# Patient Record
Sex: Female | Born: 1975 | Race: White | Hispanic: No | Smoking: Never smoker
Health system: Southern US, Community
[De-identification: ages and names within clinical notes are randomized; demographics above are authoritative.]

## PROBLEM LIST (undated history)

## (undated) DIAGNOSIS — M1990 Unspecified osteoarthritis, unspecified site: Secondary | ICD-10-CM

## (undated) DIAGNOSIS — B999 Unspecified infectious disease: Secondary | ICD-10-CM

## (undated) DIAGNOSIS — E119 Type 2 diabetes mellitus without complications: Secondary | ICD-10-CM

## (undated) DIAGNOSIS — A6000 Herpesviral infection of urogenital system, unspecified: Secondary | ICD-10-CM

## (undated) DIAGNOSIS — E079 Disorder of thyroid, unspecified: Secondary | ICD-10-CM

## (undated) DIAGNOSIS — Q6589 Other specified congenital deformities of hip: Secondary | ICD-10-CM

## (undated) DIAGNOSIS — N809 Endometriosis, unspecified: Secondary | ICD-10-CM

## (undated) HISTORY — PX: JOINT REPLACEMENT: SHX530

## (undated) HISTORY — PX: APPENDECTOMY: SHX54

## (undated) HISTORY — DX: Herpesviral infection of urogenital system, unspecified: A60.00

## (undated) HISTORY — DX: Unspecified osteoarthritis, unspecified site: M19.90

---

## 1999-02-28 ENCOUNTER — Other Ambulatory Visit: Admission: RE | Admit: 1999-02-28 | Discharge: 1999-02-28 | Payer: Self-pay | Admitting: Obstetrics and Gynecology

## 2000-04-22 ENCOUNTER — Other Ambulatory Visit: Admission: RE | Admit: 2000-04-22 | Discharge: 2000-04-22 | Payer: Self-pay | Admitting: Obstetrics and Gynecology

## 2000-10-14 ENCOUNTER — Other Ambulatory Visit: Admission: RE | Admit: 2000-10-14 | Discharge: 2000-10-14 | Payer: Self-pay | Admitting: Obstetrics and Gynecology

## 2001-01-19 ENCOUNTER — Encounter: Admission: RE | Admit: 2001-01-19 | Discharge: 2001-04-19 | Payer: Self-pay | Admitting: Obstetrics and Gynecology

## 2001-02-03 ENCOUNTER — Inpatient Hospital Stay (HOSPITAL_COMMUNITY): Admission: AD | Admit: 2001-02-03 | Discharge: 2001-02-03 | Payer: Self-pay | Admitting: Obstetrics and Gynecology

## 2001-02-14 ENCOUNTER — Encounter (HOSPITAL_COMMUNITY): Admission: RE | Admit: 2001-02-14 | Discharge: 2001-02-21 | Payer: Self-pay | Admitting: Obstetrics and Gynecology

## 2001-02-18 ENCOUNTER — Encounter (INDEPENDENT_AMBULATORY_CARE_PROVIDER_SITE_OTHER): Payer: Self-pay

## 2001-02-18 ENCOUNTER — Inpatient Hospital Stay (HOSPITAL_COMMUNITY): Admission: AD | Admit: 2001-02-18 | Discharge: 2001-02-23 | Payer: Self-pay | Admitting: Obstetrics and Gynecology

## 2001-02-18 ENCOUNTER — Encounter: Payer: Self-pay | Admitting: Obstetrics and Gynecology

## 2001-03-22 ENCOUNTER — Other Ambulatory Visit: Admission: RE | Admit: 2001-03-22 | Discharge: 2001-03-22 | Payer: Self-pay | Admitting: Obstetrics and Gynecology

## 2003-02-07 ENCOUNTER — Other Ambulatory Visit: Admission: RE | Admit: 2003-02-07 | Discharge: 2003-02-07 | Payer: Self-pay | Admitting: Obstetrics and Gynecology

## 2003-07-18 ENCOUNTER — Encounter: Admission: RE | Admit: 2003-07-18 | Discharge: 2003-08-22 | Payer: Self-pay | Admitting: Family Medicine

## 2004-05-15 ENCOUNTER — Other Ambulatory Visit: Admission: RE | Admit: 2004-05-15 | Discharge: 2004-05-15 | Payer: Self-pay | Admitting: Obstetrics and Gynecology

## 2005-09-07 ENCOUNTER — Other Ambulatory Visit: Admission: RE | Admit: 2005-09-07 | Discharge: 2005-09-07 | Payer: Self-pay | Admitting: Obstetrics and Gynecology

## 2006-02-02 ENCOUNTER — Inpatient Hospital Stay (HOSPITAL_COMMUNITY): Admission: RE | Admit: 2006-02-02 | Discharge: 2006-02-05 | Payer: Self-pay | Admitting: Orthopedic Surgery

## 2007-08-23 ENCOUNTER — Ambulatory Visit (HOSPITAL_COMMUNITY): Admission: RE | Admit: 2007-08-23 | Discharge: 2007-08-23 | Payer: Self-pay | Admitting: Obstetrics and Gynecology

## 2007-09-19 ENCOUNTER — Ambulatory Visit (HOSPITAL_COMMUNITY): Admission: RE | Admit: 2007-09-19 | Discharge: 2007-09-19 | Payer: Self-pay | Admitting: Internal Medicine

## 2007-10-17 ENCOUNTER — Ambulatory Visit (HOSPITAL_COMMUNITY): Admission: RE | Admit: 2007-10-17 | Discharge: 2007-10-17 | Payer: Self-pay | Admitting: Obstetrics and Gynecology

## 2007-11-21 ENCOUNTER — Ambulatory Visit (HOSPITAL_COMMUNITY): Admission: RE | Admit: 2007-11-21 | Discharge: 2007-11-21 | Payer: Self-pay | Admitting: Obstetrics and Gynecology

## 2007-12-21 ENCOUNTER — Ambulatory Visit (HOSPITAL_COMMUNITY): Admission: RE | Admit: 2007-12-21 | Discharge: 2007-12-21 | Payer: Self-pay | Admitting: Obstetrics and Gynecology

## 2010-03-04 ENCOUNTER — Inpatient Hospital Stay (HOSPITAL_COMMUNITY): Admission: RE | Admit: 2010-03-04 | Discharge: 2010-03-06 | Payer: Self-pay | Admitting: Orthopedic Surgery

## 2010-09-14 ENCOUNTER — Encounter: Payer: Self-pay | Admitting: Obstetrics and Gynecology

## 2010-11-09 LAB — BASIC METABOLIC PANEL
BUN: 3 mg/dL — ABNORMAL LOW (ref 6–23)
Calcium: 8 mg/dL — ABNORMAL LOW (ref 8.4–10.5)
Calcium: 9 mg/dL (ref 8.4–10.5)
Chloride: 103 mEq/L (ref 96–112)
Creatinine, Ser: 0.82 mg/dL (ref 0.4–1.2)
GFR calc Af Amer: >60 mL/min (ref >60)
GFR calc Af Amer: >60 mL/min (ref >60)
GFR calc non Af Amer: >60 mL/min (ref >60)
GFR calc non Af Amer: >60 mL/min (ref >60)
Glucose, Bld: 199 mg/dL — ABNORMAL HIGH (ref 70–99)
Glucose, Bld: 252 mg/dL — ABNORMAL HIGH (ref 70–99)
Potassium: 3.4 mEq/L — ABNORMAL LOW (ref 3.5–5.1)
Potassium: 4.2 mEq/L (ref 3.5–5.1)
Potassium: 4.3 mEq/L (ref 3.5–5.1)
Sodium: 136 mEq/L (ref 135–145)
Sodium: 138 mEq/L (ref 135–145)

## 2010-11-09 LAB — CBC
HCT: 31.2 % — ABNORMAL LOW (ref 36.0–46.0)
HCT: 33.6 % — ABNORMAL LOW (ref 36.0–46.0)
HCT: 40 % (ref 36.0–46.0)
Hemoglobin: 10.8 g/dL — ABNORMAL LOW (ref 12.0–15.0)
Hemoglobin: 13.8 g/dL (ref 12.0–15.0)
MCHC: 34.2 g/dL (ref 30.0–36.0)
MCHC: 34.5 g/dL (ref 30.0–36.0)
MCV: 86.8 fL (ref 78.0–100.0)
Platelets: 233 10*3/uL (ref 150–400)
Platelets: 270 10*3/uL (ref 150–400)
RBC: 3.59 MIL/uL — ABNORMAL LOW (ref 3.87–5.11)
RDW: 14.2 % (ref 11.5–15.5)
WBC: 5 10*3/uL (ref 4.0–10.5)
WBC: 8.2 10*3/uL (ref 4.0–10.5)
WBC: 8.7 10*3/uL (ref 4.0–10.5)

## 2010-11-09 LAB — DIFFERENTIAL
Basophils Absolute: 0 10*3/uL (ref 0.0–0.1)
Basophils Relative: 1 % (ref 0–1)
Lymphocytes Relative: 36 % (ref 12–46)
Monocytes Relative: 5 % (ref 3–12)
Neutro Abs: 2.8 10*3/uL (ref 1.7–7.7)
Neutrophils Relative %: 56 % (ref 43–77)

## 2010-11-09 LAB — URINALYSIS, ROUTINE W REFLEX MICROSCOPIC
Bilirubin Urine: NEGATIVE
Hgb urine dipstick: NEGATIVE
Nitrite: NEGATIVE
Protein, ur: NEGATIVE mg/dL
Urobilinogen, UA: 0.2 mg/dL (ref 0.0–1.0)

## 2010-11-09 LAB — PROTIME-INR: INR: 0.99 (ref 0.00–1.49)

## 2010-11-09 LAB — GLUCOSE, CAPILLARY
Glucose-Capillary: 179 mg/dL — ABNORMAL HIGH (ref 70–99)
Glucose-Capillary: 185 mg/dL — ABNORMAL HIGH (ref 70–99)
Glucose-Capillary: 229 mg/dL — ABNORMAL HIGH (ref 70–99)
Glucose-Capillary: 240 mg/dL — ABNORMAL HIGH (ref 70–99)
Glucose-Capillary: 247 mg/dL — ABNORMAL HIGH (ref 70–99)

## 2010-11-09 LAB — TYPE AND SCREEN: ABO/RH(D): B POS

## 2010-11-09 LAB — SURGICAL PCR SCREEN: MRSA, PCR: NEGATIVE

## 2010-11-09 LAB — PREGNANCY, URINE: Preg Test, Ur: NEGATIVE

## 2010-11-09 LAB — APTT: aPTT: 29 seconds (ref 24–37)

## 2011-01-09 NOTE — Op Note (Signed)
[facility] of [facility]  Patient:    [name], [name]                        MRN: [id number] Proc. Date: 02/20/01 Adm. Date:  [id number] Attending:  Wandalee Ferdinand                           Operative Report  PREOPERATIVE DIAGNOSES:       1. Intrauterine pregnancy at 30 weeks                                  and five days gestation.                               2. Preeclampsia - severe.                               3. Intrauterine growth restriction.  POSTOPERATIVE DIAGNOSES:      1. Intrauterine pregnancy at 30 weeks                                  and five days gestation.                               2. Preeclampsia - severe.                               3. Intrauterine growth restriction.  OPERATION:                    Primary low transverse cesarean section.  SURGEON:                      Rudy Jew. Ashley Royalty, M.D.  ANESTHESIA:                   Epidural.  FINDINGS:                     A 2 pound 2 ounce female, Apgars 7 at one minute and 8 at five minutes and sent to the neonatal intensive care unit.  Arterial cord pH 7.29.  ESTIMATED BLOOD LOSS:         600 cc.  COMPLICATIONS:                None.  PACKS AND DRAINS:             Foley catheter.  COUNTS:                       Sponge, needle and instrument counts were correct reported as correct x 2.  DESCRIPTION OF PROCEDURE:     The patient was taken to the operating room and placed in the sitting position where after adequate epidural anesthesia was administered, she was placed in the supine position and prepped and draped in the usual manner for abdominal surgery.  Foley catheter was placed.  The Pfannenstiel incision was made down to the level of the fascia which was nicked with a knife, incised transversely with  Mayo scissors.  The underlying rectus muscles were separated from the fascia using sharp and blunt dissection.  Rectus muscles were separated in the midline exposing  the peritoneum which was elevated with hemostats and entered atraumatically with Metzenbaum scissors.  The incision was extended longitudinally.  Uterus was identified and a bladder flap was freed by incising anterior uterine serosa and sharply and bluntly dissecting the bladder inferiorly.  It was held in place with a bladder blade.  Uterus was then entered through a low transferred incision using sharp and blunt dissection.  Membranes were ruptured. The fluid was noted to be clear.  The infant was delivered from the vertex presentation in an atraumatic manner.  The infant was suctioned.  The cord was triply clamped, cut and the infant given immediately to the waiting pediatrics team, Dr. Alison Murray, in attendance.  Arterial cord pH was obtained from an isolated segment.  Next, regular cord blood was obtained.  The placenta and membranes were removed in their entirety and submitted to pathology for histologic studies.  The uterus was exteriorized.  The uterus was then closed with running layers of #1 Vicryl, the first in a running locking layer, the second a was a running, intermittently locking and imbricating layer.  Two additional figure-of-eight sutures were required to obtain hemostasis.  Hemostasis was noted.  Uterus, tubes and ovaries were noted to be otherwise unremarkable. They were returned to the abdominal cavity.  Copious irrigation was accomplished.  Hemostasis was once again noted.  The fascia was then closed with 0 Vicryl in a running fashion.  Copious irrigation was accomplished. Hemostasis was noted.  The skin was closed with staples.  At the conclusion of the procedure, the urine was clear and copious.  The patient was then returned to the recovery room in excellent condition. DD:  02/20/01 TD:  02/20/01 Job: [id number]

## 2011-01-09 NOTE — Op Note (Signed)
[name], [name]              ACCOUNT NO.:  [id number]   MEDICAL RECORD NO.:  [id number]          PATIENT TYPE:  INP   LOCATION:  0002                         FACILITY:  [facility]   PHYSICIAN:  Madlyn Frankel. Charlann Boxer, M.D.  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   POSTOPERATIVE DIAGNOSIS:  Right hip osteoarthritis secondary to  developmental dysplasia of the hip.   PROCEDURE:  Right total hip replacement.   COMPONENTS USED:  1.  48 mm Duraloc option cup, single cancellous bone screw.  2.  A Summit Duofix HA coated high offset size 2 stem and a 28 +1.5 ball.   SURGEON:  Madlyn Frankel. Charlann Boxer, M.D.   ASSISTANT:  Cherly Beach, M.D.   ANESTHESIA:  General.   BLOOD LOSS:  13 mL.   DRAINS:  None.   COMPLICATIONS:  None.   INDICATIONS FOR PROCEDURE:  [name] is a 35 year old female who presented in  consultation for right hip pain.  Radiographically she had evidence of  dysplasia which was a mild degree but had advanced degenerative changes of  the hip.  She had failed conservative measures and based on her quality of  life at the time was very unhappy with the way things were going.  Given her  age and potential childbearing age, we had significant discussion of hip  replacement procedures including bearing surfaces.  We discussed the  potential need for revision surgery at some point in her lifetime, talked  about bearing surface, metal, iron, placenta and concerns were raised.  The  plan was designed for her to have a total hip replacement with ceramic on  ceramic bearing surface. Risks and dislocation, infection, and other  complications from the hip were all discussed and consent obtained.   PROCEDURE IN DETAIL:  The patient was brought to operative theater.  Once  adequate anesthesia and  preoperative antibiotics were administered the  patient was positioned in the left lateral decubitus position right side up.  The right lower extremity prepped and draped in sterile fashion following  pre scrub. Lateral incision was made for posterior approach to the hip.  Iliotibial band and gluteal fascia was incised in line with the incision.  Short external rotators taken down separate from the posterior capsule which  was saved for protection against the sciatic nerve.  In order to expose the  posterior aspect of the acetabulum during the case, I was unable to get this  to be reapproximated due to some debridement associated with labrectomy.  Nonetheless, tissue was preserved for protection against the sciatic nerve.   The hip was dislocated.  The neck osteotomy was initially made for the S-Rom  femoral component.   The plan was to put S-Rom in based on her hip dysplasia however, as will be  noted when I reamed I was unable to get to a size 52 cup and for that reason  I could not S-Rom which  is limited to 32 mm ceramic head.  Following the  neck osteotomy, attention was directed to acetabulum.   Acetabular exposure was obtained including the labrectomy.  Once acetabulum  was fully exposed I began reaming with a 43 reamer down to the medial wall  to get adequate coverage.  I then sequentially reamed up but had to stop  with a 47 reamer based on the size of the patient's pelvis.  The decision  was made to switch over to Summit which was outside the room and brought in  sterilely.   The final 48 mm Duraloc option cup was impacted with 35-40 degrees of  abduction and forward flexed 20 degrees anatomically.  A trial liner was  placed prior to permission any screws for a trial reduction.  At this point  attention was directed to the femur. Femoral exposure was obtained.  Based  on the change S-Rom to Summit the tip of the trochanter was identified. The  canal was entered and reaming  began axially with straight reamers.  I was  able to ream down to the size 3 with the axis straight reamers from the  axilla reamers.  For this reason I began broaching with a 1.  I did pass the  1 broach with the tip of this head marked to the tip of the trochanter in  effort to get an idea and once this was down I used the calcar planer in  order to mill down the proximal femur followed by the use of an oscillating  saw to remove further bone.  This allowed my femoral head to be at the tip  of the trochanter.   Following this, I broached with a size 2 broach and had an excellent fit. I  did not feel I was able to get back up to a size 3 because there was already  exposed medial calcar bone.  The trial reduction was carried out with  excellent stability with a 28 1.5 mm ball.  Soft tissues appeared to be  appropriately tensioned.   At this point trial components were removed.   The single cancellous bone screw was placed into the ileum with excellent  purchase.  The final ceramic 48/50 x 28 mm ceramic liner was impacted  without complication.  The rim of the cup was entirely visualized through  this whole time.   Following this, attention was directed to the femur where the final size 2  high offset femoral stem was impacted to the level where the broach was.  Based on this I did repeat a trial with 1.5 and felt the hip was very stable  with 1-2 mm of shuck and the leg lengths appeared to be appropriate.  She  was a little short to begin with.   The final 28 1.5 ceramic ball was then impacted onto a clean and dry  trunnion without complication and the hip reduced.  Throughout the case the  hip was irrigated again at this point based on her soft tissues and a  capsulectomy performed.  No capsular repair was carried out no  reapproximating of the short external rotators carried out.   At this point #1 Ethibond was utilized to reapproximate the iliotibial band, a running #1 Vicryl  was used on the gluteal fascia.  The remaining wound was  closed in layers with a 4-0 Monocryl on the skin.  Hip was cleaned, dried,  dressed sterilely with Steri-Strips, dressing, sponges, and tape.  The  patient  was transferred to recovery in stable condition.     Madlyn Frankel Charlann Boxer, M.D.  Electronically Signed    MDO/MEDQ  D:  02/02/2006  T:  02/02/2006  Job:  [id number]

## 2011-01-09 NOTE — H&P (Signed)
NAMEMarland Kitchen  [name], [name]              ACCOUNT NO.:  [id number]   MEDICAL RECORD NO.:  [id number]          PATIENT TYPE:  INP   LOCATION:  NA                           FACILITY:  [facility]   PHYSICIAN:  Lynn Frankel. Charlann [place], M.D.  DATE OF BIRTH:  [birth year]   DATE OF ADMISSION:  02/02/2006  DATE OF DISCHARGE:                                HISTORY & PHYSICAL   CHIEF COMPLAINT:  Pain in my right hip.   PRESENT ILLNESS:  This 35 year old white female seen by [place] for continued  progressive problems concerning pain into her right hip.  She was referred  to [place] through the courtesy of Dr. Allie Bossier of [facility].  She has had problems with this hip for considerable period of time.  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  She has shown to have advanced  joint space collapse to the hip.  It was noted the patient had more medial  wear on the hip.  After much discussion including the risks, the benefits of  surgery we decided the patient would benefit from surgical intervention and  is being admitted for a total hip replacement arthroplasty utilizing the  ceramic on ceramic system with DePuy SROM.   PAST MEDICAL HISTORY:  This young lady has been in relatively good health  throughout her lifetime.  She is hypothyroid, takes Synthroid 0.125 mg a  day.  She has no medical allergies.  She has no family physician.  She also  has history of hypertension, but only during her pregnancy.  She had a  cesarean section in [place] of 2002 with no problems.   FAMILY HISTORY:  Positive for hypertension in her mother's side and lung  cancer in family.   SOCIAL HISTORY:  The patient is married.  She is a Neurosurgeon.  No intake of tobacco or alcohol products.  Has one child 70 years  old and caregiver after surgery will be her mother and father.   REVIEW OF SYSTEMS:  CNS:  No seizures, shoulder  paralysis, numbness, or  double vision.  RESPIRATORY:  No productive cough.  No hemoptysis.  No  shortness of breath.  CARDIOVASCULAR:  No chest pain.  No angina.  No  orthopnea.  GASTROINTESTINAL:  No nausea, vomiting, melena, or bloody  stools.  GENITOURINARY:  No discharge, dysuria, hematuria.  MUSCULOSKELETAL:  Primarily in present illness.   PHYSICAL EXAMINATION:  GENERAL:  Alert, cooperative, and friendly somewhat  slightly overweight 35 year old white female.  VITAL SIGNS:  Blood pressure 130/86, pulse 72, respirations 12.  HEENT:  Normocephalic.  PERRLA.  EOM intact.  Oropharynx is clear.  CHEST:  Clear to auscultation.  No rhonchi.  No rales.  HEART:  Regular rate and rhythm.  No murmurs are heard.  ABDOMEN:  Soft, nontender.  Liver, spleen not felt.  GENITALIA:  Not done.  Not pertinent to present illness.  RECTAL:  Not done.  Not pertinent to present illness.  PELVIC:  Not done.  Not  pertinent to present illness.  BREASTS:  Not done.  Not pertinent to present illness.  EXTREMITIES:  Right hip shows pain with internal, external rotation on the  anterior lateral aspect of the hip.  She has a slight external rotation  contracture.  The hip has a tendency to fall on external rotation.   ADMITTING DIAGNOSES:  Dysplastic right hip with osteoarthritis.   PLAN:  Patient will undergo right total hip replacement arthroplasty.  We  plan for a three-day stay and then home with home physical therapy and her  family's support.      [name].      [name], M.D.  Electronically Signed    DLU/MEDQ  D:  01/29/2006  T:  01/29/2006  Job:  [id number]   cc:   Vanita Panda. Magnus Ivan, M.D.  Fax: [phone]

## 2011-01-09 NOTE — Discharge Summary (Signed)
[name], HANBACK              ACCOUNT NO.:  [id number]   MEDICAL RECORD NO.:  [id number]          PATIENT TYPE:  INP   LOCATION:  1519                         FACILITY:  [facility]   PHYSICIAN:  Madlyn Frankel. Charlann Boxer, M.D.  DATE OF BIRTH:  August 30, 1975   DATE OF ADMISSION:  02/02/2006  DATE OF DISCHARGE:  02/05/2006                                 DISCHARGE SUMMARY   ADMISSION DIAGNOSIS:  Dysplastic right hip with osteoarthritis.   DISCHARGE DIAGNOSES:  1.  Dysplastic right hip with osteoarthritis.  2.  Mild postoperative anemia    Operation on February 02, 2006, the patient underwent right total hip  replacement arthroplasty, Dooley L. Cherlynn June., assisted.   BRIEF HISTORY:  This is a 35 year old lady seen for progressive problems  concerning her right hip.  X-rays have shown dysplasia of the hip.  Advanced  degenerative changes were noted in the face of dysplasia.  A she really is  quite unhappy with her hip, having  more and more difficulty getting about.  She has nearly constant pain into the hip.  After discussing the risks and  benefits of surgery, specifically the fact that she is in childbearing ages,  it was decided to go ahead with a ceramic on ceramic bearing surface.   COURSE IN THE HOSPITAL:  The patient tolerated the surgical procedure quite  well, remained cheerful throughout her hospitalization, was very pleased  with the reduction of pain that she had into the hip.  She worked diligently  with occupational therapy and physical therapy and the total hip protocol.  Home health was arranged through [place].  On the day of discharge the wound  was dry.  Neurovascular was intact to the operative extremity.  We did not  use Coumadin protocol with her as we expect her to increase her activities  rapidly.  Enteric-coated daily will  be used.   Laboratory values in the hospital hematologically showed preoperative CBC  completely within normal limits.  Her hemoglobin was 13.0  with hematocrit  38.3.  Final hemoglobin was 10.2, was hematocrit 29.8.  Blood chemistries  remained normal.  Urinalysis essentially negative for a urinary tract  infection.  No electrocardiogram and no chest x-ray seen on this chart.   CONDITION ON DISCHARGE:  Improved, stable.   PLAN:  The patient is discharged to her home, continue partial weightbearing  on the operative extremity.  She will take:   1.  Two Tylenol q.6h.  2.  OxyContin 5 mg p.r.n. breakthrough pain  3.  Robaxin 500 mg for muscle spasm.  4.  One enteric-coated aspirin a day.  5.  Colace daily as a stool softener.   Return to see Dr. Charlann Boxer about 2 weeks after date of surgery.  Call if any  problems.      Dooley L. Cherlynn June.      Madlyn Frankel Charlann Boxer, M.D.  Electronically Signed    DLU/MEDQ  D:  02/17/2006  T:  02/17/2006  Job:  [id number]   cc:   Vanita Panda. Magnus Ivan, M.D.  Fax: [phone]

## 2011-01-09 NOTE — H&P (Signed)
[facility] of [facility]  Patient:    [name], [name]                        MRN: [id number] Adm. Date:  02/18/01 Attending:  Fayrene Fearing A. Ashley Royalty, M.D.                         History and Physical  HISTORY:                      This is a 35 year old prima gravida, EDC April 14, 2001 approximately 30 weeks 3 days gestation.  Prenatal care has been complicated by hypothyroidism as well as tendencies toward high blood pressure in recent weeks.  In addition, an ultrasound February 10, 2001 revealed mean gestational age of [redacted] weeks 0 days which was estimated to be the 10th percentile for that gestational age.  Estimated fetal weight was 1134 g. Amniotic fluid index was 8.2.  Initial blood pressure in the office on or about October 14, 2000 revealed a value of 150/76.  Blood pressure April 19 was 100/70, May 2 110/80, May 29 136/86, June 13 144/100.  On June 13 I took the blood pressure myself in the right arm supine and it was 132/90.  At that time a CBC was ordered and the hematocrit was 37.8, platelet count 270,000, SGOT 23, and SGPT 16.  Albumin was diminished at 2.6.  Nonstress test was ordered and reactive.  The afore mentioned ultrasound was ordered on that day as well.  Patient has since been coming back for more frequent antenatal visits.  Blood pressure June 17 was 140/100.  Patient continued to deny any severe symptoms.  On June 20 a 24 hour urine was ordered and the total protein was noted to be 6.2 g/dl.  Creatinine clearance was 127 which was felt to be normal for pregnancy.  Patient was seen in the office on June 24 at which time blood pressure by me in the left side right arm in the lateral recumbent position was 148/98. Patient continued to deny any "severe" symptoms.  She was asked to return on February 16, 2001 for repeat OB visit.  However, unfortunately she was noncompliant with that request.  Thereafter patient returned today for antenatal visit.  Blood  pressure by me in the right arm sitting was 162/105. In the right arm on the left side it was 144/100.  She continues to deny any visual disturbances, epigastric pain, headaches.  She is admitted for surveillance regarding the diagnosis of preeclampsia.  Laboratory determinations and consideration for the need for delivery.  MEDICATIONS:                  1. Prenatal vitamins.                               2. Synthroid 0.1 mg q.d.  PAST MEDICAL HISTORY:         Negative.  PAST SURGICAL HISTORY:        Negative.  ALLERGIES:                    None.  FAMILY HISTORY:               Noncontributory.  SOCIAL HISTORY:               Patient denies use of alcohol or  tobacco.  REVIEW OF SYSTEMS:            Noncontributory.  PHYSICAL EXAMINATION  GENERAL:                      Well-developed, well-nourished pleasant white female in no acute distress.  VITAL SIGNS:                  Afebrile.  Vital signs stable and as above.  SKIN:                         Warm and dry without lesions.  LYMPH:                        There is no supraclavicular, cervical, or inguinal adenopathy.  HEENT:                        Normocephalic.  NECK:                         Supple without thyromegaly.  CHEST:                        Lungs are clear.  CARDIAC:                      Regular rate and rhythm without murmurs, gallops, rubs.  BREASTS:                      Deferred.  ABDOMEN:                      Gravid with a fundal height of approximately 30 cm.  Fetal heart tones are auscultated with the Doppler.  MUSCULOSKELETAL:              Full range of motion with 1+ dependent edema. DTRs are 1+ and equal bilaterally.  PELVIC:                       Deferred.  ACCESSORY CLINICAL FINDINGS:  Nonstress test was performed today at maternity admissions and the result was reactive.  Blood pressure at that encounter was 159/98.  Urine protein in the office at this time is 4+.  IMPRESSION:                    1. Intrauterine pregnancy at 30 weeks 3 days                                  gestation.                               2. Preeclampsia, rule out severe.                               3. Intrauterine growth restriction probably                                  secondary to #2.  PLAN:  Patient is to be hospitalized.  Laboratory determinations will be made as well as fetal surveillance, ultrasound, and consideration for delivery. DD:  02/18/01 TD:  02/18/01 Job: [id number]

## 2011-01-09 NOTE — Discharge Summary (Signed)
[facility] of [facility]  Patient:    [name], [name]                        MRN: [id number] Adm. Date:  [id number] Disc. Date: [id number] Attending:  Wandalee Ferdinand                           Discharge Summary  DISCHARGE DIAGNOSES:          1. Intrauterine pregnancy at 30 weeks 3 days                                  gestation.                               2. Pregnancy induced hypertension-fear.                               3. Intrauterine growth restriction.                               4. Preterm birth living child.  OPERATIONS AND SPECIAL PROCEDURES:           Primary low transverse cesarean section.  CONSULTATIONS:                None.  DISCHARGE MEDICATIONS:        1. Aldomet 500 mg p.o. t.i.d.                               2. Tylox.  HISTORY AND PHYSICAL:         This is a 35 year old primigravida, [facility] April 26, 2001, approximately 30 weeks 3 days gestation. Prenatal care was complicated by hypothyroidism as well as tendencies toward high blood pressure in recent weeks. In addition, an ultrasound on February 10, 2001 revealed mean gestational age of [redacted] weeks 0 days which was estimated to be approximately the 10th percentile for that gestational age. The patient was brought into the hospital for evaluation of hypertension and lab work in order to determine the appropriateness of further expected management versus delivery. For the remainder of the past medical history and history and physical, please see chart.  HOSPITAL COURSE:              The patient was admitted to [facility]. Admission laboratory studies were drawn. The lab work looked reassuring. However, the patient was noted to have on ultrasound a 28 week 4 day composite gestational age with an estimated fetal weight of 1150 g, which was felt to be less than the 10th percentile. The amniotic fluid index was only 2.1. Decision was made to move toward delivery. The  patient was given two doses of betamethasone at 24-hour intervals. She subsequently was taken to the operating room on February 20, 2001. At that time, she underwent primary low transverse cesarean section. The procedure yielded a 2-pound 2-ounce female, Apgars 7 at one minute, 8 at five minutes, sent to the neonatal intensive care unit. Arterial cord pH was 7.29. The procedure was uncomplicated. The patients postoperative course was quite benign. She continued to have modest residual hypertension for which  she was started on Aldomet 500 mg p.o. t.i.d. The preeclampsia resolved nicely and magnesium sulfate was ultimately discontinued. The patient was discharged home on February 23, 2001 afebrile and in satisfactory condition.  DISPOSITION:                  The patient is to return to [facility] in several days for repeat blood pressure assessment. In addition, she is to return in four to six weeks for standard postoperative checkup as well. DD:  03/20/01 TD:  03/20/01 Job: [id number]

## 2011-08-24 ENCOUNTER — Emergency Department (HOSPITAL_COMMUNITY)
Admission: EM | Admit: 2011-08-24 | Discharge: 2011-08-24 | Disposition: A | Discharge status: Home / Self Care | Payer: Self-pay | Source: Home / Self Care | Attending: Emergency Medicine | Admitting: Emergency Medicine

## 2011-08-24 ENCOUNTER — Emergency Department (INDEPENDENT_AMBULATORY_CARE_PROVIDER_SITE_OTHER): Payer: BC Managed Care – PPO

## 2011-08-24 DIAGNOSIS — IMO0002 Reserved for concepts with insufficient information to code with codable children: Secondary | ICD-10-CM

## 2011-08-24 DIAGNOSIS — M47814 Spondylosis without myelopathy or radiculopathy, thoracic region: Secondary | ICD-10-CM

## 2011-08-24 DIAGNOSIS — S239XXA Sprain of unspecified parts of thorax, initial encounter: Secondary | ICD-10-CM

## 2011-08-24 HISTORY — DX: Disorder of thyroid, unspecified: E07.9

## 2011-08-24 MED ORDER — HYDROCODONE-IBUPROFEN 7.5-200 MG PO TABS: 1.0000 | ORAL_TABLET | Freq: Four times a day (QID) | ORAL | Status: AC | PRN

## 2011-08-24 NOTE — ED Provider Notes (Signed)
History     CSN: [id number]  Arrival date & time 08/24/11  1191   First MD Initiated Contact with Patient 08/24/11 1036      Chief Complaint  Patient presents with  . Back Pain    (Consider location/radiation/quality/duration/timing/severity/associated sxs/prior treatment) HPI Comments: FRIDAY SLIPPED DOWN SOME STAIRS, pain, under my scapula runs around to my L armpit, its very sore in this spot" (see illustration), hurst the most when i move in a certain way, or when i lean back" ABOUT 1 MONTH AGO I FELL on my yard and had like a puleld muscle sensation very similar and in the same area almost" that faded away, until Friday. No SOB NO COUGH NO CP  Patient is a 35 y.o. female presenting with back pain. The history is provided by the patient.  Back Pain  This is a recurrent problem. The current episode started more than 2 days ago. The problem occurs constantly. The problem has not changed since onset.The pain is associated with falling. The pain is present in the thoracic spine. The quality of the pain is described as aching. The pain is at a severity of 6/10. The pain is mild. The symptoms are aggravated by twisting, bending and certain positions. The pain is the same all the time. Stiffness is present in the morning. Pertinent negatives include no chest pain, no fever, no numbness, no bowel incontinence, no bladder incontinence, no pelvic pain, no paresthesias, no paresis and no tingling. The treatment provided no relief.    Past Medical History  Diagnosis Date  . Thyroid disease     Past Surgical History  Procedure Date  . Joint replacement   . Cesarean section     No family history on file.  History  Substance Use Topics  . Smoking status: Never Smoker   . Smokeless tobacco: Not on file  . Alcohol Use: No    OB History    Grav Para Term Preterm Abortions TAB SAB Ect Mult Living                  Review of Systems  Constitutional: Negative for fever.    Respiratory: Negative for cough and shortness of breath.   Cardiovascular: Negative for chest pain.  Gastrointestinal: Negative for bowel incontinence.  Genitourinary: Negative for bladder incontinence and pelvic pain.  Musculoskeletal: Positive for back pain.  Skin: Negative for color change.  Neurological: Negative for tingling, numbness and paresthesias.    Allergies  Review of patient's allergies indicates no known allergies.  Home Medications  No current outpatient prescriptions on file.  BP 150/89  Pulse 66  Temp(Src) 98.9 F (37.2 C) (Oral)  Resp 18  SpO2 99%  LMP 08/21/2011  Physical Exam  Constitutional: She appears well-developed and well-nourished. No distress.  HENT:  Head: Normocephalic.  Eyes: Conjunctivae are normal.  Neck: Normal range of motion. Neck supple. No JVD present.  Pulmonary/Chest: Effort normal and breath sounds normal.  Musculoskeletal: She exhibits tenderness.       Thoracic back: She exhibits tenderness and pain. She exhibits normal range of motion, no swelling, no deformity, no laceration, no spasm and normal pulse.       Back:  Skin: Skin is warm. No erythema.    ED Course  Procedures (including critical care time)  Labs Reviewed - No data to display No results found.   No diagnosis found.    MDM  Paravertebral Thoracic pain after 2 recent trauma related events- No fractures and no  sub-luxations noted on x-rays. Exam suggestive of erector spinalis elicited tenderness. With palpation and active ROM        Jimmie Molly, MD 08/24/11 1136

## 2011-08-24 NOTE — ED Notes (Signed)
States she slipped down some stairs Friday, having pain between scapula that has worsened and is now radiating around to lt axilla.  Pain is sharp with movement.  States she fell in her yard approx 1 month ago and had similar pain that resolved.  Using heating pad and advil

## 2012-05-05 ENCOUNTER — Observation Stay (HOSPITAL_COMMUNITY)
Admission: EM | Admit: 2012-05-05 | Discharge: 2012-05-06 | Disposition: A | Discharge status: Home / Self Care | Payer: BC Managed Care – PPO | Attending: Emergency Medicine | Admitting: Emergency Medicine

## 2012-05-05 ENCOUNTER — Encounter (HOSPITAL_COMMUNITY): Payer: Self-pay

## 2012-05-05 ENCOUNTER — Emergency Department (HOSPITAL_COMMUNITY): Payer: BC Managed Care – PPO

## 2012-05-05 DIAGNOSIS — R209 Unspecified disturbances of skin sensation: Secondary | ICD-10-CM | POA: Insufficient documentation

## 2012-05-05 DIAGNOSIS — R079 Chest pain, unspecified: Principal | ICD-10-CM | POA: Insufficient documentation

## 2012-05-05 DIAGNOSIS — M25519 Pain in unspecified shoulder: Secondary | ICD-10-CM | POA: Insufficient documentation

## 2012-05-05 DIAGNOSIS — E079 Disorder of thyroid, unspecified: Secondary | ICD-10-CM | POA: Insufficient documentation

## 2012-05-05 DIAGNOSIS — R0609 Other forms of dyspnea: Secondary | ICD-10-CM | POA: Insufficient documentation

## 2012-05-05 DIAGNOSIS — R0989 Other specified symptoms and signs involving the circulatory and respiratory systems: Secondary | ICD-10-CM | POA: Insufficient documentation

## 2012-05-05 DIAGNOSIS — IMO0002 Reserved for concepts with insufficient information to code with codable children: Secondary | ICD-10-CM | POA: Insufficient documentation

## 2012-05-05 LAB — URINALYSIS, ROUTINE W REFLEX MICROSCOPIC
Bilirubin Urine: NEGATIVE
Ketones, ur: NEGATIVE mg/dL
Leukocytes, UA: NEGATIVE
Nitrite: NEGATIVE
Protein, ur: NEGATIVE mg/dL
pH: 5 (ref 5.0–8.0)

## 2012-05-05 LAB — URINE MICROSCOPIC-ADD ON

## 2012-05-05 LAB — BASIC METABOLIC PANEL
BUN: 13 mg/dL (ref 6–23)
Calcium: 8.6 mg/dL (ref 8.4–10.5)
Chloride: 104 mEq/L (ref 96–112)
Creatinine, Ser: 0.91 mg/dL (ref 0.50–1.10)
GFR calc Af Amer: >90 mL/min (ref >90)
GFR calc non Af Amer: 80 mL/min — ABNORMAL LOW (ref >90)

## 2012-05-05 LAB — POCT I-STAT TROPONIN I
Troponin i, poc: 0 ng/mL (ref 0.00–0.08)
Troponin i, poc: 0 ng/mL (ref 0.00–0.08)

## 2012-05-05 LAB — CBC
HCT: 37.1 % (ref 36.0–46.0)
Hemoglobin: 13 g/dL (ref 12.0–15.0)
MCH: 30.7 pg (ref 26.0–34.0)
MCHC: 35 g/dL (ref 30.0–36.0)
RBC: 4.24 MIL/uL (ref 3.87–5.11)

## 2012-05-05 LAB — POCT PREGNANCY, URINE: Preg Test, Ur: NEGATIVE

## 2012-05-05 MED ORDER — ACETAMINOPHEN 325 MG PO TABS
650.0000 mg | ORAL_TABLET | Freq: Once | ORAL | Status: AC
  Administered 2012-05-05: 650 mg via ORAL
  Filled 2012-05-05: qty 2

## 2012-05-05 NOTE — ED Provider Notes (Signed)
36-year-old female noted onset while walking of a pressure feeling in her chest with associated dyspnea and palpitations. She was treated by EMS with nitroglycerin with significant relief. Her only cardiac risk factor is a grandparent who had coronary disease onset in her 64s. She's a nonsmoker without a history of diabetes, hypertension, hyperlipidemia. On exam, lungs are clear, heart is regular rate and rhythm, abdomen soft and nontender, extremities no cyanosis or edema. Symptoms are worrisome enough that I feel she needs evaluation in the CDU. Her BMI is 37.9 which will preclude a coronary CT scan. She will need stress echocardiogram.   Date: 05/05/2012  Rate: 67  Rhythm: normal sinus rhythm  QRS Axis: normal  Intervals: normal  ST/T Wave abnormalities: nonspecific T wave changes  Conduction Disutrbances:none  Narrative Interpretation: Borderline low voltage, and borderline T wave changes. When compared with ECG of 01/28/2006, no significant changes are seen.  Old EKG Reviewed: unchanged    Dione Booze, MD 05/05/12 1820

## 2012-05-05 NOTE — ED Notes (Signed)
Pt denies CP and SOB. Pt reports she is still experiencing some left arm pain but thinks it is d/t the IV. Pt A&Ox4. Pt ambulated from wheelchair into bed.

## 2012-05-05 NOTE — ED Provider Notes (Signed)
History     CSN: [id number]  Arrival date & time 05/05/12  1515   First MD Initiated Contact with Patient 05/05/12 1516      Chief Complaint  Patient presents with  . Chest Pain    (Consider location/radiation/quality/duration/timing/severity/associated sxs/prior treatment) Patient is a 36 y.o. female presenting with chest pain. The history is provided by the patient and the EMS personnel. No language interpreter was used.  Chest Pain The chest pain began less than 1 hour ago. Duration of episode(s) is 45 minutes. Chest pain occurs constantly. The chest pain is improving. The pain is associated with breathing. At its most intense, the pain is at 5/10. The pain is currently at 3/10. The severity of the pain is mild. The quality of the pain is described as pressure-like. The pain radiates to the left shoulder and left arm. Chest pain is worsened by deep breathing. Primary symptoms include shortness of breath and palpitations. Pertinent negatives for primary symptoms include no fever, no syncope, no cough, no wheezing, no abdominal pain, no nausea, no vomiting and no dizziness.  The palpitations also occurred with shortness of breath. The palpitations did not occur with dizziness.  Associated symptoms include numbness.  Pertinent negatives for associated symptoms include no lower extremity edema, no near-syncope and no weakness. She tried nitroglycerin and aspirin for the symptoms.    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 States that she was coming in from the mobile units outside when she started having shortness of breath with palpitations and chest pressure 5/10. Also states that she's had left lower  extremity numbness intermittently x2 days.  She has no PCP in a past medical history of hypothyroidism, hip replacement x2 in 2011 with Dr. Charlann Boxer. States that her  grandmother had MI at 18 and died. Mother had hypertension. She has an IUD. Ask pain or long trips. Patient was given nitroglycerin x2 in route and baby aspirin x4.  States the chest pressure was 5/10 prior to the nitroglycerin and 3/10 post nitroglycerin sl. Denies Nausea or diaphoresis. Has not taking any of her hypothyroid medication in 4-5 years. Or has not been checked by a pcp. EMS reports her heart rate was anywhere from 60-102 in route.  Patient states that she feels like she just can't catch her breath.  O2 sats 98 on RA.   Past Medical History  Diagnosis Date  . Thyroid disease     Past Surgical History  Procedure Date  . Joint replacement   . Cesarean section     History reviewed. No pertinent family history.  History  Substance Use Topics  . Smoking status: Never Smoker   . Smokeless tobacco: Not on file  . Alcohol Use: No    OB History    Grav Para Term Preterm Abortions TAB SAB Ect Mult Living                  Review of Systems  Constitutional: Negative.  Negative for fever.  HENT: Negative.  Negative for neck pain and neck stiffness.   Eyes: Negative.   Respiratory: Positive for shortness of breath. Negative for cough and wheezing.   Cardiovascular: Positive for chest pain and palpitations. Negative for syncope and near-syncope.  Gastrointestinal: Negative.  Negative for nausea, vomiting and abdominal pain.  Neurological: Positive for light-headedness and numbness. Negative for dizziness, facial asymmetry, speech difficulty, weakness and headaches.  Psychiatric/Behavioral: Negative.  All other systems reviewed and are negative.    Allergies  Review of patient's allergies indicates no known allergies.  Home Medications  No current outpatient prescriptions on file.  BP 119/71  Temp 98.3 F (36.8 C) (Oral)  Resp 20  SpO2 98%  Physical Exam  Nursing note and vitals reviewed. Constitutional: She is oriented to person, place, and time. She appears  well-developed and well-nourished.  HENT:  Head: Normocephalic and atraumatic.  Eyes: Conjunctivae normal and EOM are normal. Pupils are equal, round, and reactive to light.  Neck: Normal range of motion. Neck supple.  Cardiovascular: Normal rate.   Pulmonary/Chest: Effort normal and breath sounds normal. No respiratory distress.  Abdominal: Soft. Bowel sounds are normal. She exhibits no distension.  Musculoskeletal: Normal range of motion. She exhibits no edema and no tenderness.  Neurological: She is alert and oriented to person, place, and time. She has normal reflexes. No cranial nerve deficit. Coordination normal.  Skin: Skin is warm and dry.  Psychiatric: She has a normal mood and affect.    ED Course  Procedures (including critical care time)   Labs Reviewed  BASIC METABOLIC PANEL  URINALYSIS, ROUTINE W REFLEX MICROSCOPIC  D-DIMER, QUANTITATIVE  TSH   No results found.   No diagnosis found.    MDM  Midsternal chest pressure with exertion today at school. Somewhat relieved with nitroglycerin. Patient will move to CDU for the chest pain protocol. BMI is 3.71 today she will be doing the  stress test.  Troponin negative x1. Second troponin pending. EKG normal sinus rhythm. This was a shear sheered visit with Dr. Preston Fleeting. Report given to [facility] in the CDU unit.   Labs Reviewed  BASIC METABOLIC PANEL - Abnormal; Notable for the following:    Glucose, Bld 216 (*)     GFR calc non Af Amer 80 (*)     All other components within normal limits  URINALYSIS, ROUTINE W REFLEX MICROSCOPIC - Abnormal; Notable for the following:    APPearance HAZY (*)     Glucose, UA >1000 (*)     Hgb urine dipstick SMALL (*)     All other components within normal limits  URINE MICROSCOPIC-ADD ON - Abnormal; Notable for the following:    Bacteria, UA MANY (*)     Crystals URIC ACID CRYSTALS (*)  ABUNDANT   All other components within normal limits  D-DIMER, QUANTITATIVE  POCT I-STAT TROPONIN I   CBC  POCT PREGNANCY, URINE  TSH          Remi Haggard, NP 05/05/12 1837

## 2012-05-05 NOTE — ED Provider Notes (Signed)
Patient placed on chest pain protocol.  Patient reports an episode of dizziness and palpitations associated with left arm pain while at work today, lasting about one hour.  Symptoms resolved after receiving nitroglycerin and aspirin by EMS.  Patient resting comfortably at present.  CAROx4.  Skin warm, dry.  Lungs CTA bilaterally.  S1/S2, RRR, no murmur.  Abdomen soft, bowel sounds present.  Strong distal pulses palpated all extremities.  Monitor reveals NSR without ectopy.  12 lead reviewed, no indication of ischemia.  Cardiac markers negative.  Blood glucose of 215, spilling glucose into urine.  No history of diabetes, hypertension, hyperlipidemia.  A1C pending.  Family history of heart disease, hypertension.  Diagnostic and treatment plan discussed with patient.  Will need to be started on medication for glycemic control upon discharge home.  Patient states she did not do well with the metformin used for the treatment of PCOS.  Consider starting on glyburide.  Jimmye Norman, NP 05/05/12 2137

## 2012-05-05 NOTE — ED Notes (Signed)
Patient complaining of headache. Requesting tylenol. Midlevel notified. Will continue to monitor patient

## 2012-05-05 NOTE — ED Notes (Signed)
Correction to Blood Glucose by EMS.  Correct Blood glucose 189

## 2012-05-05 NOTE — ED Notes (Signed)
Pt given 2 SL nitro which improved pressure and 324mg  ASA

## 2012-05-05 NOTE — ED Notes (Signed)
Pt was walking across parking lot and began to feel palpataions and became short of breathj

## 2012-05-06 DIAGNOSIS — R072 Precordial pain: Secondary | ICD-10-CM

## 2012-05-06 LAB — HEMOGLOBIN A1C: Mean Plasma Glucose: 200 mg/dL — ABNORMAL HIGH (ref <117)

## 2012-05-06 NOTE — ED Provider Notes (Signed)
Patient with a hx sig for thyroid dz was placed in CDU on CPP protocol by [employer], podB. Patient care resumed from Dr. Norlene Campbell .  Patient is here for a stress echo after having an episode of CP associated with radiation to left arm & dizziness. Symptoms have resolved and pt is currently pain free.  While in obeservation over night the pt slept well and had no complaints, per nursing staff. Patient re-evaluated and is resting comfortable, VSS, with no new complaints or concerns at this time. Plan per previous provider is to dispo pending echo reults. On exam: hemodynamically stable, NAD, heart w/ RRR, lungs CTAB, Chest & abd non-tender, no peripheral edema or calf tenderness.   11:24 AM Stress Study Conclusions  - Stress ECG conclusions: There were no stress arrhythmias or conduction abnormalities. The stress ECG was normal. - Staged echo: There was no echocardiographic evidence for stress-induced ischemia. Impressions:  - Stress echocardiogram with no chest pain, no ST changes and no stress-induced wall motion abnormalities. Bruce protocol. Stress echocardiography. Height: Height: 160cm. Height: 63in. Weight: Weight: 97.3kg. Weight: 214lb. Body mass index: BMI: 38kg/m^2. Body surface area: BSA: 2.84m^2. Blood pressure: 118/96. Patient status: Observation.  Patient is to be discharged with recommendation to follow up with PCP in regards to today's hospital visit. Pts symptoms unlikely to be of CAD etiology and pt has not reported any CP while in my care in the CDU. Labs and imaging reviewed again prior to dc. CXR and ECG with no acute abnormalities, normal stress as above, and neg troponins x2. Pt has been advised return to the ED if develop any exertional CP- strict return precautions discussed & patient's questions answered. Pt appears reliable for follow up and is agreeable to discharge.        Jaci Carrel, [place] 05/06/12 1130

## 2012-05-06 NOTE — ED Provider Notes (Signed)
Medical screening examination/treatment/procedure(s) were conducted as a shared visit with non-physician practitioner(s) and myself.  I personally evaluated the patient during the encounter   Wilber Fini, MD 05/06/12 0041 

## 2012-05-06 NOTE — ED Provider Notes (Signed)
Medical screening examination/treatment/procedure(s) were conducted as a shared visit with non-physician practitioner(s) and myself.  I personally evaluated the patient during the encounter   Dione Booze, MD 05/06/12 [id number]

## 2012-05-06 NOTE — ED Notes (Signed)
Pt. Been trans ported to stress echo

## 2012-05-07 LAB — URINE CULTURE

## 2012-05-08 NOTE — ED Notes (Signed)
+   Urine Chart sent to EDP office for review. 

## 2012-05-09 NOTE — ED Notes (Signed)
Chart returned from EDP office . Rx for Trimethoprim/Sulfamethoxazole 160/800 mg sig: one tablet po BID #6 no refills written by Ardine Bjork [facility]

## 2012-05-11 NOTE — ED Provider Notes (Signed)
Medical screening examination/treatment/procedure(s) were performed by non-physician practitioner and as supervising physician I was immediately available for consultation/collaboration.   Laray Anger, DO 05/11/12 1252

## 2012-05-13 ENCOUNTER — Telehealth (HOSPITAL_COMMUNITY): Payer: Self-pay | Admitting: *Deleted

## 2012-05-14 ENCOUNTER — Telehealth (HOSPITAL_COMMUNITY): Payer: Self-pay | Admitting: Emergency Medicine

## 2012-06-07 ENCOUNTER — Ambulatory Visit (INDEPENDENT_AMBULATORY_CARE_PROVIDER_SITE_OTHER): Payer: BC Managed Care – PPO | Admitting: Emergency Medicine

## 2012-06-07 VITALS — BP 118/78 | HR 96 | Temp 97.7°F | Resp 18 | Ht 63.0 in | Wt 210.4 lb

## 2012-06-07 DIAGNOSIS — E119 Type 2 diabetes mellitus without complications: Secondary | ICD-10-CM

## 2012-06-07 DIAGNOSIS — R000 Tachycardia, unspecified: Secondary | ICD-10-CM

## 2012-06-07 DIAGNOSIS — E1169 Type 2 diabetes mellitus with other specified complication: Secondary | ICD-10-CM

## 2012-06-07 DIAGNOSIS — J029 Acute pharyngitis, unspecified: Secondary | ICD-10-CM

## 2012-06-07 DIAGNOSIS — E282 Polycystic ovarian syndrome: Secondary | ICD-10-CM

## 2012-06-07 NOTE — Progress Notes (Signed)
[facility] [address], [place] [address] [phone]  Date:  06/07/2012   Name:  [name]   DOB:  1975-09-16   MRN:  [id number]  PCP:  No primary provider on file.    Chief Complaint: Abdominal Pain, Diarrhea and Cough   History of Present Illness:  [name] is a 36 y.o. very pleasant female patient who presents with the following:  Saturday morning developed upper abdominal pain not associated with vomiting but was nauseated.  Had some loose stools Saturday and Sunday.  No blood mucous or pus in stools.  No fever or chills.  Saturday night developed a sore throat and a non productive cough.  Has postnasal drainage with no nasal discharge or congestion.  Post nasal drainage is very bad tasting.    Yesterday symptoms improved and she was able to eat a small amount but still had abdominal pain.  Unable to work again today due abdominal pain.  No specific food intolerance.  No caffeine or alcohol use.  Avoids use of NSAIDS.  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  Cough and sore throat are now nearly resolved.  During the course of the visit her mother prompted her to mention her er visit for chest pain.  She had a negative stress test but describes frequent episodes of tachycardia and dizziness.  She complained that she received no guidance from the ER on follow up.  In reviewing her visit, I noticed that her blood sugars over the past year have been in the 150-250 range.  When she was questioned about her diabetes, she claimed that she had normal blood sugars but had polycystic ovary disease and that her sugars were normal.  She was seen in the past by an endocrinologist and put on meds including metformin that made her feel "bad" and she stopped them.    There is no problem list on file for this patient.   Past Medical History  Diagnosis Date  . Thyroid disease   . Asthma     Past Surgical History  Procedure Date  . Joint  replacement   . Cesarean section     History  Substance Use Topics  . Smoking status: Never Smoker   . Smokeless tobacco: Not on file  . Alcohol Use: No    Family History  Problem Relation Age of Onset  . Hypertension Mother   . Hypertension Brother     No Known Allergies  Medication list has been reviewed and updated.  No current outpatient prescriptions on file prior to visit.    Review of Systems:  As per HPI, otherwise negative.    Physical Examination: Filed Vitals:   06/07/12 1109  BP: 118/78  Pulse: 96  Temp: 97.7 F (36.5 C)  Resp: 18   Filed Vitals:   06/07/12 1109  Height: 5\' 3"  (1.6 m)  Weight: 210 lb 6.4 oz (95.437 kg)   Body mass index is 37.27 kg/(m^2). Ideal Body Weight: Weight in (lb) to have BMI = 25: 140.8   GEN: WDWN, NAD, Non-toxic, A & O x 3 HEENT: Atraumatic, Normocephalic. Neck supple. No masses, No LAD. Ears and Nose: No external deformity. CV: RRR, No M/G/R. No JVD. No thrill. No extra heart sounds. PULM: CTA B, no wheezes, crackles, rhonchi. No retractions. No resp. distress. No accessory muscle use. ABD: S, NT, ND, +BS. No rebound. No HSM. EXTR: No c/c/e NEURO Normal gait.  PSYCH: Normally  interactive. Conversant. Not depressed or anxious appearing.  Calm demeanor.    Assessment and Plan: Type II diabetes mellitus Morbid obesity Tachydysrhythmia by history  Pharyngitis  Refer to endocrinology High risk of sleep apnea but denies symptoms Cardiology referral   Carmelina Dane, MD  Results for orders placed in visit on 06/07/12  POCT RAPID STREP A (OFFICE)      Component Value Range   Rapid Strep A Screen Negative  Negative   I have reviewed and agree with documentation. Robert P. Merla Riches, M.D.

## 2012-06-15 ENCOUNTER — Ambulatory Visit: Payer: BC Managed Care – PPO | Admitting: Endocrinology

## 2012-06-19 ENCOUNTER — Encounter (HOSPITAL_COMMUNITY): Payer: Self-pay | Admitting: Anesthesiology

## 2012-06-19 ENCOUNTER — Encounter (HOSPITAL_COMMUNITY): Payer: Self-pay | Admitting: *Deleted

## 2012-06-19 ENCOUNTER — Emergency Department (HOSPITAL_COMMUNITY): Payer: BC Managed Care – PPO | Admitting: Anesthesiology

## 2012-06-19 ENCOUNTER — Emergency Department (HOSPITAL_COMMUNITY): Payer: BC Managed Care – PPO

## 2012-06-19 ENCOUNTER — Inpatient Hospital Stay: Admit: 2012-06-19 | Payer: Self-pay | Admitting: Surgery

## 2012-06-19 ENCOUNTER — Observation Stay (HOSPITAL_COMMUNITY)
Admission: EM | Admit: 2012-06-19 | Discharge: 2012-06-20 | Disposition: A | Discharge status: Home / Self Care | Payer: BC Managed Care – PPO | Attending: General Surgery | Admitting: General Surgery

## 2012-06-19 ENCOUNTER — Encounter (HOSPITAL_COMMUNITY): Payer: Self-pay | Admitting: Emergency Medicine

## 2012-06-19 ENCOUNTER — Encounter (HOSPITAL_COMMUNITY)
Admission: EM | Disposition: A | Discharge status: Home / Self Care | Payer: Self-pay | Source: Home / Self Care | Attending: Emergency Medicine

## 2012-06-19 DIAGNOSIS — K3580 Unspecified acute appendicitis: Principal | ICD-10-CM | POA: Insufficient documentation

## 2012-06-19 DIAGNOSIS — E119 Type 2 diabetes mellitus without complications: Secondary | ICD-10-CM | POA: Insufficient documentation

## 2012-06-19 DIAGNOSIS — R1032 Left lower quadrant pain: Secondary | ICD-10-CM | POA: Insufficient documentation

## 2012-06-19 DIAGNOSIS — R1031 Right lower quadrant pain: Secondary | ICD-10-CM | POA: Insufficient documentation

## 2012-06-19 HISTORY — DX: Type 2 diabetes mellitus without complications: E11.9

## 2012-06-19 HISTORY — PX: LAPAROSCOPIC APPENDECTOMY: SHX408

## 2012-06-19 LAB — URINALYSIS, ROUTINE W REFLEX MICROSCOPIC
Bilirubin Urine: NEGATIVE
Ketones, ur: 15 mg/dL — AB
Nitrite: NEGATIVE
Specific Gravity, Urine: 1.02 (ref 1.005–1.030)
Urobilinogen, UA: 0.2 mg/dL (ref 0.0–1.0)

## 2012-06-19 LAB — CBC
HCT: 38.7 % (ref 36.0–46.0)
Hemoglobin: 13.5 g/dL (ref 12.0–15.0)
MCH: 30.1 pg (ref 26.0–34.0)
MCV: 86.2 fL (ref 78.0–100.0)
RBC: 4.49 MIL/uL (ref 3.87–5.11)
WBC: 7.5 10*3/uL (ref 4.0–10.5)

## 2012-06-19 LAB — COMPREHENSIVE METABOLIC PANEL
AST: 25 U/L (ref 0–37)
BUN: 9 mg/dL (ref 6–23)
CO2: 23 mEq/L (ref 19–32)
Chloride: 104 mEq/L (ref 96–112)
Creatinine, Ser: 0.88 mg/dL (ref 0.50–1.10)
GFR calc Af Amer: >90 mL/min (ref >90)
GFR calc non Af Amer: 83 mL/min — ABNORMAL LOW (ref >90)
Glucose, Bld: 206 mg/dL — ABNORMAL HIGH (ref 70–99)
Total Bilirubin: 0.4 mg/dL (ref 0.3–1.2)

## 2012-06-19 LAB — WET PREP, GENITAL

## 2012-06-19 LAB — GLUCOSE, CAPILLARY
Glucose-Capillary: 195 mg/dL — ABNORMAL HIGH (ref 70–99)
Glucose-Capillary: 204 mg/dL — ABNORMAL HIGH (ref 70–99)

## 2012-06-19 LAB — LIPASE, BLOOD: Lipase: 43 U/L (ref 11–59)

## 2012-06-19 SURGERY — APPENDECTOMY, LAPAROSCOPIC
Anesthesia: General | Site: Abdomen | Wound class: Clean Contaminated

## 2012-06-19 MED ORDER — SODIUM CHLORIDE 0.9 % IV BOLUS (SEPSIS)
500.0000 mL | Freq: Once | INTRAVENOUS | Status: AC
  Administered 2012-06-19: 500 mL via INTRAVENOUS

## 2012-06-19 MED ORDER — METOCLOPRAMIDE HCL 5 MG/ML IJ SOLN
10.0000 mg | Freq: Once | INTRAMUSCULAR | Status: AC
  Administered 2012-06-19: 10 mg via INTRAVENOUS
  Filled 2012-06-19: qty 2

## 2012-06-19 MED ORDER — LACTATED RINGERS IV SOLN
INTRAVENOUS | Status: DC | PRN
  Administered 2012-06-19 (×2): via INTRAVENOUS

## 2012-06-19 MED ORDER — IOHEXOL 300 MG/ML  SOLN
20.0000 mL | INTRAMUSCULAR | Status: DC
  Administered 2012-06-19: 20 mL via ORAL

## 2012-06-19 MED ORDER — DEXTROSE 5 % IV SOLN
1.0000 g | Freq: Four times a day (QID) | INTRAVENOUS | Status: DC
  Administered 2012-06-20 (×2): 1 g via INTRAVENOUS
  Filled 2012-06-19 (×3): qty 1

## 2012-06-19 MED ORDER — ROCURONIUM BROMIDE 100 MG/10ML IV SOLN
INTRAVENOUS | Status: DC | PRN
  Administered 2012-06-19: 35 mg via INTRAVENOUS
  Administered 2012-06-19: 5 mg via INTRAVENOUS

## 2012-06-19 MED ORDER — ARTIFICIAL TEARS OP OINT
TOPICAL_OINTMENT | OPHTHALMIC | Status: DC | PRN
  Administered 2012-06-19: 1 via OPHTHALMIC

## 2012-06-19 MED ORDER — ONDANSETRON HCL 4 MG/2ML IJ SOLN: 4.0000 mg | Freq: Once | INTRAMUSCULAR | Status: DC | PRN

## 2012-06-19 MED ORDER — NEOSTIGMINE METHYLSULFATE 1 MG/ML IJ SOLN
INTRAMUSCULAR | Status: DC | PRN
  Administered 2012-06-19: 2 mg via INTRAVENOUS

## 2012-06-19 MED ORDER — DEXTROSE 5 % IV SOLN
2.0000 g | INTRAVENOUS | Status: DC
  Filled 2012-06-19: qty 2

## 2012-06-19 MED ORDER — BUPIVACAINE-EPINEPHRINE 0.5% -1:200000 IJ SOLN
INTRAMUSCULAR | Status: DC | PRN
  Administered 2012-06-19: 6 mL

## 2012-06-19 MED ORDER — HYDROMORPHONE HCL PF 1 MG/ML IJ SOLN
0.2500 mg | INTRAMUSCULAR | Status: DC | PRN
  Administered 2012-06-19: 0.5 mg via INTRAVENOUS

## 2012-06-19 MED ORDER — PROPOFOL 10 MG/ML IV BOLUS
INTRAVENOUS | Status: DC | PRN
  Administered 2012-06-19: 30 mg via INTRAVENOUS
  Administered 2012-06-19: 200 mg via INTRAVENOUS
  Administered 2012-06-19: 30 mg via INTRAVENOUS

## 2012-06-19 MED ORDER — FENTANYL CITRATE 0.05 MG/ML IJ SOLN
INTRAMUSCULAR | Status: DC | PRN
  Administered 2012-06-19: 100 ug via INTRAVENOUS
  Administered 2012-06-19: 50 ug via INTRAVENOUS
  Administered 2012-06-19: 100 ug via INTRAVENOUS

## 2012-06-19 MED ORDER — DEXTROSE 5 % IV SOLN
1.0000 g | Freq: Three times a day (TID) | INTRAVENOUS | Status: DC
  Administered 2012-06-19: 1 g via INTRAVENOUS
  Filled 2012-06-19 (×2): qty 1

## 2012-06-19 MED ORDER — HYDROMORPHONE HCL PF 1 MG/ML IJ SOLN
INTRAMUSCULAR | Status: AC
  Filled 2012-06-19: qty 1

## 2012-06-19 MED ORDER — GLYCOPYRROLATE 0.2 MG/ML IJ SOLN
INTRAMUSCULAR | Status: DC | PRN
  Administered 2012-06-19: 0.4 mg via INTRAVENOUS

## 2012-06-19 MED ORDER — ONDANSETRON HCL 4 MG PO TABS: 4.0000 mg | ORAL_TABLET | Freq: Four times a day (QID) | ORAL | Status: DC | PRN

## 2012-06-19 MED ORDER — HEMOSTATIC AGENTS (NO CHARGE) OPTIME
TOPICAL | Status: DC | PRN
  Administered 2012-06-19: 1 via TOPICAL

## 2012-06-19 MED ORDER — ONDANSETRON HCL 4 MG/2ML IJ SOLN
INTRAMUSCULAR | Status: DC | PRN
  Administered 2012-06-19: 4 mg via INTRAVENOUS

## 2012-06-19 MED ORDER — ENOXAPARIN SODIUM 40 MG/0.4ML ~~LOC~~ SOLN
40.0000 mg | SUBCUTANEOUS | Status: DC
  Administered 2012-06-20: 40 mg via SUBCUTANEOUS
  Filled 2012-06-19 (×2): qty 0.4

## 2012-06-19 MED ORDER — SUCCINYLCHOLINE CHLORIDE 20 MG/ML IJ SOLN
INTRAMUSCULAR | Status: DC | PRN
  Administered 2012-06-19: 100 mg via INTRAVENOUS

## 2012-06-19 MED ORDER — HYDROMORPHONE HCL PF 1 MG/ML IJ SOLN
1.0000 mg | Freq: Once | INTRAMUSCULAR | Status: AC
  Administered 2012-06-19: 1 mg via INTRAVENOUS
  Filled 2012-06-19: qty 1

## 2012-06-19 MED ORDER — SODIUM CHLORIDE 0.9 % IV BOLUS (SEPSIS)
1000.0000 mL | Freq: Once | INTRAVENOUS | Status: AC
  Administered 2012-06-19: 1000 mL via INTRAVENOUS

## 2012-06-19 MED ORDER — INSULIN ASPART 100 UNIT/ML ~~LOC~~ SOLN
0.0000 [IU] | SUBCUTANEOUS | Status: DC
  Administered 2012-06-20: 5 [IU] via SUBCUTANEOUS
  Administered 2012-06-20 (×2): 3 [IU] via SUBCUTANEOUS

## 2012-06-19 MED ORDER — LIDOCAINE HCL (CARDIAC) 20 MG/ML IV SOLN
INTRAVENOUS | Status: DC | PRN
  Administered 2012-06-19: 80 mg via INTRAVENOUS

## 2012-06-19 MED ORDER — SODIUM CHLORIDE 0.9 % IR SOLN
Status: DC | PRN
  Administered 2012-06-19: 1

## 2012-06-19 MED ORDER — ONDANSETRON HCL 4 MG/2ML IJ SOLN: 4.0000 mg | Freq: Four times a day (QID) | INTRAMUSCULAR | Status: DC | PRN

## 2012-06-19 MED ORDER — IOHEXOL 300 MG/ML  SOLN
100.0000 mL | Freq: Once | INTRAMUSCULAR | Status: AC | PRN
  Administered 2012-06-19: 100 mL via INTRAVENOUS

## 2012-06-19 MED ORDER — ONDANSETRON HCL 4 MG/2ML IJ SOLN: 4.0000 mg | Freq: Once | INTRAMUSCULAR | Status: DC

## 2012-06-19 MED ORDER — KCL IN DEXTROSE-NACL 20-5-0.9 MEQ/L-%-% IV SOLN
INTRAVENOUS | Status: DC
  Administered 2012-06-20: via INTRAVENOUS
  Filled 2012-06-19 (×3): qty 1000

## 2012-06-19 SURGICAL SUPPLY — 44 items
ADH SKN CLS APL DERMABOND .7 (GAUZE/BANDAGES/DRESSINGS) ×1
ADH SKN CLS LQ APL DERMABOND (GAUZE/BANDAGES/DRESSINGS) ×1
APPLIER CLIP ROT 10 11.4 M/L (STAPLE)
APR CLP MED LRG 11.4X10 (STAPLE)
BAG SPEC RTRVL LRG 6X4 10 (ENDOMECHANICALS) ×1
BLADE SURG ROTATE 9660 (MISCELLANEOUS) IMPLANT
CANISTER SUCTION 2500CC (MISCELLANEOUS) ×2 IMPLANT
CHLORAPREP W/TINT 26ML (MISCELLANEOUS) ×2 IMPLANT
CLIP APPLIE ROT 10 11.4 M/L (STAPLE) IMPLANT
CLOTH BEACON ORANGE TIMEOUT ST (SAFETY) ×2 IMPLANT
COVER SURGICAL LIGHT HANDLE (MISCELLANEOUS) ×2 IMPLANT
CUTTER FLEX LINEAR 45M (STAPLE) ×2 IMPLANT
DERMABOND ADHESIVE PROPEN (GAUZE/BANDAGES/DRESSINGS) ×1
DERMABOND ADVANCED (GAUZE/BANDAGES/DRESSINGS) ×1
DERMABOND ADVANCED .7 DNX12 (GAUZE/BANDAGES/DRESSINGS) ×1 IMPLANT
DERMABOND ADVANCED .7 DNX6 (GAUZE/BANDAGES/DRESSINGS) IMPLANT
DRAPE UTILITY 15X26 W/TAPE STR (DRAPE) ×4 IMPLANT
DRAPE WARM FLUID 44X44 (DRAPE) ×2 IMPLANT
ELECT REM PT RETURN 9FT ADLT (ELECTROSURGICAL) ×2
ELECTRODE REM PT RTRN 9FT ADLT (ELECTROSURGICAL) ×1 IMPLANT
ENDOLOOP SUT PDS II  0 18 (SUTURE)
ENDOLOOP SUT PDS II 0 18 (SUTURE) IMPLANT
GLOVE BIO SURGEON STRL SZ8 (GLOVE) ×2 IMPLANT
GLOVE BIOGEL PI IND STRL 8 (GLOVE) ×1 IMPLANT
GLOVE BIOGEL PI INDICATOR 8 (GLOVE) ×1
GOWN STRL NON-REIN LRG LVL3 (GOWN DISPOSABLE) ×4 IMPLANT
HEMOSTAT SNOW SURGICEL 2X4 (HEMOSTASIS) ×1 IMPLANT
KIT BASIN OR (CUSTOM PROCEDURE TRAY) ×2 IMPLANT
KIT ROOM TURNOVER OR (KITS) ×2 IMPLANT
NS IRRIG 1000ML POUR BTL (IV SOLUTION) ×2 IMPLANT
PAD ARMBOARD 7.5X6 YLW CONV (MISCELLANEOUS) ×4 IMPLANT
POUCH SPECIMEN RETRIEVAL 10MM (ENDOMECHANICALS) ×2 IMPLANT
RELOAD STAPLE TA45 3.5 REG BLU (ENDOMECHANICALS) ×4 IMPLANT
SCALPEL HARMONIC ACE (MISCELLANEOUS) ×2 IMPLANT
SET IRRIG TUBING LAPAROSCOPIC (IRRIGATION / IRRIGATOR) ×2 IMPLANT
SPECIMEN JAR SMALL (MISCELLANEOUS) ×2 IMPLANT
SUT MON AB 4-0 PC3 18 (SUTURE) ×2 IMPLANT
TOWEL OR 17X24 6PK STRL BLUE (TOWEL DISPOSABLE) ×2 IMPLANT
TOWEL OR 17X26 10 PK STRL BLUE (TOWEL DISPOSABLE) ×2 IMPLANT
TRAY FOLEY CATH 14FR (SET/KITS/TRAYS/PACK) ×2 IMPLANT
TRAY LAPAROSCOPIC (CUSTOM PROCEDURE TRAY) ×2 IMPLANT
TROCAR XCEL BLADELESS 5X75MML (TROCAR) ×4 IMPLANT
TROCAR XCEL BLUNT TIP 100MML (ENDOMECHANICALS) ×2 IMPLANT
WATER STERILE IRR 1000ML POUR (IV SOLUTION) IMPLANT

## 2012-06-19 NOTE — ED Provider Notes (Signed)
Pt reports she is having increasing pain.   Pt requesting something else for pain.   Pt reports she has finished both cups of contrast.  Pt is waiting for ct scan.  PE  Vitals stable,  Lungs clear, heart rrr,   Abdomen is soft,  Diffusely tender,    Pt given dilaudid 1mg  IV  Elson Areas, [place] 06/19/12 1401

## 2012-06-19 NOTE — ED Notes (Signed)
PT COMPLAINS OF LUQ ABDOMINAL PAIN SINCE YESTERDAY. STATES HAS HAD MULTPLE DIARRHEA STOOLS AS WELL AS N/V SINCE THIS AM. LAST STOOL AROUND 9 AM. DENIES FEVER.

## 2012-06-19 NOTE — Op Note (Signed)
Appendectomy, Lap, Procedure Note  Indications: The patient presented with a history of right-sided abdominal pain. A CT and clinical exam  revealed findings consistent with acute appendicitis.Options discussed with the patient. Both medical and surgical options discussed.  Risks of bleeding,  Infection,  Organ injury,  Reoperation.  She agreed to proceed.  Pre-operative Diagnosis: Acute appendicitis without mention of peritonitis  Post-operative Diagnosis: Same  Surgeon: Madalene Mickler A.   Assistants: OR  Anesthesia: General endotracheal anesthesia  ASA Class: 2  Procedure Details  The patient was seen again in the Holding Room. The risks, benefits, complications, treatment options, and expected outcomes were discussed with the patient and/or family. The possibilities of perforation of viscus, bleeding, recurrent infection, the need for additional procedures, failure to diagnose a condition, and creating a complication requiring transfusion or operation were discussed. There was concurrence with the proposed plan and informed consent was obtained. The site of surgery was properly noted. The patient was taken to Operating Room, identified as [name] and the procedure verified as Appendectomy. A Time Out was held and the above information confirmed.  The patient was placed in the supine position and general anesthesia was induced, along with placement of orogastric tube, Venodyne boots, and a Foley catheter. The abdomen was prepped and draped in a sterile fashion. The patient was placed into reverse trendelenburg position and rotated to the right.  Local anesthetic was infiltrated into the supraumbilical area and Hason canula placed and secured with 0 vicryl. .   The pneumoperitoneum was then established to steady pressure of 15 mmHg.  Additional 5 mm cannulas then placed in the left lower quadrant of the abdomen and the right upper quadrant. She had lower midline adhesions taken down  with Harmonic scalpel under direct visualization.   A careful evaluation of the entire abdomen was carried out. The patient was placed in Trendelenburg and rotated to the left.  . The small intestines were retracted in the cephalad and left lateral direction away from the pelvis and right lower quadrant. The patient was found to have an enlarged and inflamed appendix that was extending into the pelvis. There was no evidence of perforation.  The appendix was carefully dissected. The appendix was was skeletonized with the harmonic scalpel.   The appendix was divided at its base using an endo-GIA stapler. Minimal appendiceal stump was left in place. The appendix was removed from the abdomen with an Endocatch bag through the umbilical port.  There was no evidence of bleeding, leakage, or complication after division of the appendix. Irrigation was also performed and irrigate suctioned from the abdomen as well. Surgicel placed on the appendiceal mesentery.   The 5 mm trocars were removed.  The pneumoperitoneum was evacuated from the abdomen.   Umbilical fascia closed with 0 vicryl.   The trocar site skin wounds were closed with 4-0 Monocryl and dressed with Dermabond.  Instrument, sponge, and needle counts were correct at the conclusion of the case.   Findings: The appendix was found to be inflamed. There were signs of necrosis.  There was not perforation. There was abscess formation.  Estimated Blood Loss:  less than 50 mL         Drains: NONE         Total IV Fluids: 1200 mL         Specimens: APPENDIX         Complications:  None; patient tolerated the procedure well.         Disposition:  PACU - hemodynamically stable.         Condition: stable

## 2012-06-19 NOTE — Transfer of Care (Signed)
Immediate Anesthesia Transfer of Care Note  Patient: [name]  Procedure(s) Performed: Procedure(s) (LRB) with comments: APPENDECTOMY LAPAROSCOPIC (N/A)  Patient Location: PACU  Anesthesia Type:General  Level of Consciousness: sedated  Airway & Oxygen Therapy: Patient Spontanous Breathing and Patient connected to nasal cannula oxygen  Post-op Assessment: Report given to PACU RN and Post -op Vital signs reviewed and stable  Post vital signs: Reviewed and stable  Complications: No apparent anesthesia complications

## 2012-06-19 NOTE — Anesthesia Postprocedure Evaluation (Signed)
  Anesthesia Post-op Note  Patient: [name]  Procedure(s) Performed: Procedure(s) (LRB) with comments: APPENDECTOMY LAPAROSCOPIC (N/A)  Patient Location: PACU  Anesthesia Type: General   Level of Consciousness: awake, oriented, patient cooperative and responds to stimulation  Airway and Oxygen Therapy: Patient Spontanous Breathing and Patient connected to nasal cannula oxygen  Post-op Pain: mild  Post-op Assessment: Post-op Vital signs reviewed, Patient's Cardiovascular Status Stable, Respiratory Function Stable, Patent Airway, No signs of Nausea or vomiting and Pain level controlled  Post-op Vital Signs: Reviewed and stable  Complications: No apparent anesthesia complications

## 2012-06-19 NOTE — ED Notes (Signed)
CBG 195 

## 2012-06-19 NOTE — ED Provider Notes (Signed)
Medical screening examination/treatment/procedure(s) were conducted as a shared visit with non-physician practitioner(s) and myself.  I personally evaluated the patient during the encounter  Doug Sou, MD 06/19/12 1715

## 2012-06-19 NOTE — ED Notes (Signed)
REPORT CALLED TO AMANDA AUSTIN ANESTHESIA

## 2012-06-19 NOTE — ED Provider Notes (Signed)
Complains of left lower quadrant pain onset last night nonradiating accompanied by multiple of symptoms of diarrhea and 2 episodes of vomiting doesn't prove to his treatment in the emergency department. On exam patient is alert nontoxic abdomen obese tender at suprapubic area and left lower quadrants no guarding rigidity or rebound  Doug Sou, MD 06/19/12 1214

## 2012-06-19 NOTE — ED Provider Notes (Signed)
Medical screening examination/treatment/procedure(s) were conducted as a shared visit with non-physician practitioner(s) and myself.  I personally evaluated the patient during the encounter  Doug Sou, MD 06/19/12 1714

## 2012-06-19 NOTE — ED Provider Notes (Signed)
History     CSN: [id number]  Arrival date & time 06/19/12  1478   First MD Initiated Contact with Patient 06/19/12 [phone]      Chief Complaint  Patient presents with  . Abdominal Pain  . Nausea  . Emesis    (Consider location/radiation/quality/duration/timing/severity/associated sxs/prior treatment) Patient is a 36 y.o. female presenting with abdominal pain and vomiting.  Abdominal Pain The primary symptoms of the illness include abdominal pain, nausea, vomiting and diarrhea. The primary symptoms of the illness do not include shortness of breath, dysuria, vaginal discharge or vaginal bleeding. The current episode started yesterday. The onset of the illness was gradual. The problem has been gradually worsening.  Symptoms associated with the illness do not include constipation or hematuria.  Emesis  Associated symptoms include abdominal pain and diarrhea. Pertinent negatives include no headaches.   36 year old female coming in with complaint of recurrent left upper quadrant, suprapubic,  left lower quadrant pain since yesterday. States she's also had diarrhea x10 with no blood. She's also vomited x2 with no blood. Patient had the same symptoms a week ago and went to the [place] urgent care. She was sent home with no nausea medication or antidiarrheal. Patient is a known diabetic on no diabetic medication. States that Dr. Shane Crutch  had given her a prescription for metformin but it makes her sick and she has not been to take it. Also states that her stool is black. This is her third trip to the Dr. in 2 months with no explanation for her pain and diarrhea. States that she was on antibiotic (can't recall the name) in the last 3 months for a UTI.  Past Medical History  Diagnosis Date  . Thyroid disease     Past Surgical History  Procedure Date  . Joint replacement   . Cesarean section     Family History  Problem Relation Age of Onset  . Hypertension Mother   . Hypertension Brother       History  Substance Use Topics  . Smoking status: Never Smoker   . Smokeless tobacco: Not on file  . Alcohol Use: No    OB History    Grav Para Term Preterm Abortions TAB SAB Ect Mult Living                  Review of Systems  Constitutional: Negative.   HENT: Negative.   Eyes: Negative.   Respiratory: Negative.  Negative for shortness of breath.   Cardiovascular: Negative.   Gastrointestinal: Positive for nausea, vomiting, abdominal pain and diarrhea. Negative for constipation, blood in stool and anal bleeding.  Genitourinary: Negative for dysuria, hematuria, flank pain, vaginal bleeding, vaginal discharge and difficulty urinating.  Musculoskeletal: Negative.   Neurological: Negative.  Negative for dizziness, weakness, light-headedness and headaches.  Psychiatric/Behavioral: Negative.   All other systems reviewed and are negative.    Allergies  Review of patient's allergies indicates no known allergies.  Home Medications   Current Outpatient Rx  Name Route Sig Dispense Refill  . IBUPROFEN 200 MG PO TABS Oral Take 800 mg by mouth 2 (two) times daily as needed. For pain      BP 120/67  Pulse 60  Temp 98.1 F (36.7 C) (Oral)  Resp 20  SpO2 98%  Physical Exam  Nursing note and vitals reviewed. Constitutional: She is oriented to person, place, and time. She appears well-developed and well-nourished.  HENT:  Head: Normocephalic and atraumatic.  Eyes: Conjunctivae normal and EOM are normal. Pupils  are equal, round, and reactive to light.  Neck: Normal range of motion. Neck supple.  Cardiovascular: Normal rate.   Pulmonary/Chest: Effort normal. No respiratory distress.  Abdominal: Soft. She exhibits no distension. There is tenderness. There is no rebound and no guarding.  Genitourinary: Uterus normal. Pelvic exam was performed with patient prone. There is no tenderness on the right labia. There is no tenderness on the left labia. Cervix exhibits no motion  tenderness, no discharge and no friability. Left adnexum displays tenderness. There is bleeding around the vagina. No erythema around the vagina. No vaginal discharge found.  Musculoskeletal: Normal range of motion. She exhibits no edema and no tenderness.  Neurological: She is alert and oriented to person, place, and time. She has normal reflexes.  Skin: Skin is warm and dry.  Psychiatric: She has a normal mood and affect.    ED Course  Procedures (including critical care time)   Labs Reviewed  CBC  COMPREHENSIVE METABOLIC PANEL  LIPASE, BLOOD  URINALYSIS, ROUTINE W REFLEX MICROSCOPIC  OCCULT BLOOD X 1 CARD TO LAB, STOOL  GC/CHLAMYDIA PROBE AMP, GENITAL  WET PREP, GENITAL   No results found.   No diagnosis found.    MDM   Report given to CDU PA Trisha Mangle for this 36 year old patient with left lower quadrant pain suprapubic pain awaiting CT of abdomen with contrast. Patient will be dispositioned after results. Patient has migrating PCPs and would like a list for a new one. She can be discharged with pain and nausea medication with a followup according to her results. Possible GI or GYN followup as well. Labs unremarkable except glucose 195. Labs Reviewed  COMPREHENSIVE METABOLIC PANEL - Abnormal; Notable for the following:    Glucose, Bld 206 (*)     GFR calc non Af Amer 83 (*)     All other components within normal limits  URINALYSIS, ROUTINE W REFLEX MICROSCOPIC - Abnormal; Notable for the following:    APPearance HAZY (*)     Glucose, UA 100 (*)     Ketones, ur 15 (*)     All other components within normal limits  WET PREP, GENITAL - Abnormal; Notable for the following:    Yeast Wet Prep HPF POC NEGATIVE (*)     Trich, Wet Prep NEGATIVE (*)     Clue Cells Wet Prep HPF POC NEGATIVE (*)     WBC, Wet Prep HPF POC RARE (*)     All other components within normal limits  GLUCOSE, CAPILLARY - Abnormal; Notable for the following:    Glucose-Capillary 195 (*)     All  other components within normal limits  CBC  LIPASE, BLOOD  POCT PREGNANCY, URINE  OCCULT BLOOD X 1 CARD TO LAB, STOOL  GC/CHLAMYDIA PROBE AMP, GENITAL         Remi Haggard, NP 06/19/12 1519

## 2012-06-19 NOTE — ED Notes (Signed)
PHARMACY IS GOING TO SEND ATB TO OR

## 2012-06-19 NOTE — ED Notes (Addendum)
Pt c/o severe upper abdominal pain radiating to left side with n/v onset yesterday. Pt had similar symptoms a week ago and was seen at urgent care at that time.

## 2012-06-19 NOTE — ED Provider Notes (Signed)
Assumed care of patient in the CDU.   Patient awaiting CT ab/pelvis.   Patient presents today with a chief complaint of generalized abdominal pain and nausea and vomiting.  Onset of the pain was yesterday.  Reassessed patient.  She reports that her pain is controlled at this time.  On exam, Patient alert and orientated x 3, Heart RRR, Lungs CTAB, Abdomen soft with generalized tenderness to palpation.  4:30 PM Results of the CT scan show an Acute Appendicitis.  I have informed patient and have consulted General Surgery.  4:42 PM Discussed with General Surgery.  They report that they will come see the patient and admit.  Pascal Lux Conway, PA-C 06/19/12 1746

## 2012-06-19 NOTE — H&P (Signed)
[name] is an 36 y.o. female.   Chief Complaint: abdominal pain HPI: 1 day history of diffuse abdominal pain now localizing to RLQ.  Pain has worsened over the last 24 hour.  Sharp in nature.  Made worse by movement and palpation.    Past Medical History  Diagnosis Date  . Thyroid disease   . Diabetes mellitus without complication     Past Surgical History  Procedure Date  . Joint replacement   . Cesarean section     Family History  Problem Relation Age of Onset  . Hypertension Mother   . Hypertension Brother    Social History:  reports that she has never smoked. She does not have any smokeless tobacco history on file. She reports that she does not drink alcohol or use illicit drugs.  Allergies: No Known Allergies   (Not in a hospital admission)  Results for orders placed during the hospital encounter of 06/19/12 (from the past 48 hour(s))  CBC     Status: Normal   Collection Time   06/19/12 10:05 AM      Component Value Range Comment   WBC 7.5  4.0 - 10.5 K/uL    RBC 4.49  3.87 - 5.11 MIL/uL    Hemoglobin 13.5  12.0 - 15.0 g/dL    HCT 16.1  09.6 - 04.5 %    MCV 86.2  78.0 - 100.0 fL    MCH 30.1  26.0 - 34.0 pg    MCHC 34.9  30.0 - 36.0 g/dL    RDW 40.9  81.1 - 91.4 %    Platelets 223  150 - 400 K/uL   COMPREHENSIVE METABOLIC PANEL     Status: Abnormal   Collection Time   06/19/12 10:05 AM      Component Value Range Comment   Sodium 137  135 - 145 mEq/L    Potassium 3.8  3.5 - 5.1 mEq/L    Chloride 104  96 - 112 mEq/L    CO2 23  19 - 32 mEq/L    Glucose, Bld 206 (*) 70 - 99 mg/dL    BUN 9  6 - 23 mg/dL    Creatinine, Ser 7.82  0.50 - 1.10 mg/dL    Calcium 8.8  8.4 - 95.6 mg/dL    Total Protein 6.7  6.0 - 8.3 g/dL    Albumin 3.6  3.5 - 5.2 g/dL    AST 25  0 - 37 U/L    ALT 20  0 - 35 U/L    Alkaline Phosphatase 61  39 - 117 U/L    Total Bilirubin 0.4  0.3 - 1.2 mg/dL    GFR calc non Af Amer 83 (*) >90 mL/min    GFR calc Af Amer >90  >90 mL/min     LIPASE, BLOOD     Status: Normal   Collection Time   06/19/12 10:05 AM      Component Value Range Comment   Lipase 43  11 - 59 U/L   GLUCOSE, CAPILLARY     Status: Abnormal   Collection Time   06/19/12 10:54 AM      Component Value Range Comment   Glucose-Capillary 195 (*) 70 - 99 mg/dL   URINALYSIS, ROUTINE W REFLEX MICROSCOPIC     Status: Abnormal   Collection Time   06/19/12 11:50 AM      Component Value Range Comment   Color, Urine YELLOW  YELLOW    APPearance HAZY (*) CLEAR  Specific Gravity, Urine 1.020  1.005 - 1.030    pH 5.0  5.0 - 8.0    Glucose, UA 100 (*) NEGATIVE mg/dL    Hgb urine dipstick NEGATIVE  NEGATIVE    Bilirubin Urine NEGATIVE  NEGATIVE    Ketones, ur 15 (*) NEGATIVE mg/dL    Protein, ur NEGATIVE  NEGATIVE mg/dL    Urobilinogen, UA 0.2  0.0 - 1.0 mg/dL    Nitrite NEGATIVE  NEGATIVE    Leukocytes, UA NEGATIVE  NEGATIVE MICROSCOPIC NOT DONE ON URINES WITH NEGATIVE PROTEIN, BLOOD, LEUKOCYTES, NITRITE, OR GLUCOSE <1000 mg/dL.  WET PREP, GENITAL     Status: Abnormal   Collection Time   06/19/12 11:50 AM      Component Value Range Comment   Yeast Wet Prep HPF POC NEGATIVE (*) NONE SEEN    Trich, Wet Prep NEGATIVE (*) NONE SEEN    Clue Cells Wet Prep HPF POC NEGATIVE (*) NONE SEEN    WBC, Wet Prep HPF POC RARE (*) NONE SEEN   POCT PREGNANCY, URINE     Status: Normal   Collection Time   06/19/12 12:01 PM      Component Value Range Comment   Preg Test, Ur NEGATIVE  NEGATIVE    Ct Abdomen Pelvis W Contrast  06/19/2012  *RADIOLOGY REPORT*  Clinical Data: 36 year old female with abdominal and pelvic pain. Diarrhea and vomiting.  CT ABDOMEN AND PELVIS WITH CONTRAST  Technique:  Multidetector CT imaging of the abdomen and pelvis was performed following the standard protocol during bolus administration of intravenous contrast.  Contrast: 100 ml intravenous Omnipaque-300.  Comparison: None  Findings: Probable mild fatty infiltration of the liver is identified. The  spleen, adrenal glands, pancreas, kidneys and gallbladder are unremarkable.  The appendix is enlarged with adjacent inflammation compatible with appendicitis.  There is no evidence of pneumoperitoneum or abscess.  No free fluid, enlarged lymph nodes, biliary dilation or abdominal aortic aneurysm identified.  An IUD within the uterus is noted. No other bowel abnormalities are noted. The bladder is unremarkable.  No acute or suspicious bony abnormalities are identified. 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  IMPRESSION: Appendicitis without free fluid, free air or abscess.  Probable mild fatty infiltration of the liver.  These results were called to Dr. Gregary Cromer on 06/19/2012 at 4:20 p.m.   Original Report Authenticated By: Rosendo Gros, M.D.     Review of Systems  Constitutional: Negative.   HENT: Negative.   Eyes: Negative.   Respiratory: Negative.   Cardiovascular: Negative.   Gastrointestinal: Positive for abdominal pain.  Genitourinary: Negative.   Musculoskeletal: Negative.   Skin: Negative.   Neurological: Negative.   Endo/Heme/Allergies: Negative.   Psychiatric/Behavioral: Negative.     Blood pressure 126/67, pulse 62, temperature 98.1 F (36.7 C), temperature source Oral, resp. rate 20, last menstrual period 06/19/2012, SpO2 100.00%. Physical Exam  Constitutional: She is oriented to person, place, and time. She appears well-developed and well-nourished.  HENT:  Head: Normocephalic and atraumatic.  Eyes: EOM are normal. Pupils are equal, round, and reactive to light.  Neck: Normal range of motion. Neck supple.  Cardiovascular: Normal rate and regular rhythm.   Respiratory: Effort normal and breath sounds normal.  GI: There is tenderness. There is rebound, guarding and tenderness at McBurney's point.  Musculoskeletal: Normal range of motion.  Neurological: She is alert and oriented to person, place, and time.  Skin: Skin is warm and dry.  Psychiatric: She has a normal mood  and affect.  Her behavior is normal. Judgment and thought content normal.     Assessment/Plan  Acute appendicitis  Discussed medical and surgical options of treatment with complications of each and outcomes of each.  She would like to proceed with laparoscopic appendectomy.  Risks include bleeding,  Infection,  Open procedure,  Organ ijury ,  Colon injury ,  Small intestine injury,  Ureter injury,  Blood clots,  Death,  Organ failure.  She agrees to proceed.  Herberth Deharo A. 06/19/2012, 4:55 PM

## 2012-06-19 NOTE — Anesthesia Preprocedure Evaluation (Addendum)
Anesthesia Evaluation  Patient identified by MRN, date of birth, ID band Patient awake    Reviewed: Allergy & Precautions, H&P , NPO status , Patient's Chart, lab work & pertinent test results  Airway Mallampati: I TM Distance: >3 FB Neck ROM: full    Dental   Pulmonary          Cardiovascular Rhythm:regular Rate:Normal     Neuro/Psych    GI/Hepatic   Endo/Other  diabetes, Type 2  Renal/GU      Musculoskeletal   Abdominal   Peds  Hematology   Anesthesia Other Findings   Reproductive/Obstetrics                           Anesthesia Physical Anesthesia Plan  ASA: III  Anesthesia Plan: General   Post-op Pain Management:    Induction: Intravenous  Airway Management Planned: Oral ETT  Additional Equipment:   Intra-op Plan:   Post-operative Plan: Extubation in OR  Informed Consent: I have reviewed the patients History and Physical, chart, labs and discussed the procedure including the risks, benefits and alternatives for the proposed anesthesia with the patient or authorized representative who has indicated his/her understanding and acceptance.     Plan Discussed with: CRNA, Anesthesiologist and Surgeon  Anesthesia Plan Comments:         Anesthesia Quick Evaluation

## 2012-06-19 NOTE — Preoperative (Signed)
Beta Blockers   Reason not to administer Beta Blockers:Not Applicable 

## 2012-06-20 ENCOUNTER — Encounter (HOSPITAL_COMMUNITY): Payer: Self-pay | Admitting: Surgery

## 2012-06-20 LAB — COMPREHENSIVE METABOLIC PANEL
BUN: 5 mg/dL — ABNORMAL LOW (ref 6–23)
CO2: 25 mEq/L (ref 19–32)
Chloride: 103 mEq/L (ref 96–112)
Creatinine, Ser: 0.86 mg/dL (ref 0.50–1.10)
GFR calc Af Amer: >90 mL/min (ref >90)
GFR calc non Af Amer: 85 mL/min — ABNORMAL LOW (ref >90)
Total Bilirubin: 0.5 mg/dL (ref 0.3–1.2)

## 2012-06-20 LAB — CBC
HCT: 34.2 % — ABNORMAL LOW (ref 36.0–46.0)
MCH: 29.8 pg (ref 26.0–34.0)
MCV: 87.2 fL (ref 78.0–100.0)
RBC: 3.92 MIL/uL (ref 3.87–5.11)
WBC: 8.9 10*3/uL (ref 4.0–10.5)

## 2012-06-20 LAB — HEMOGLOBIN A1C: Mean Plasma Glucose: 197 mg/dL — ABNORMAL HIGH (ref <117)

## 2012-06-20 LAB — GLUCOSE, CAPILLARY: Glucose-Capillary: 170 mg/dL — ABNORMAL HIGH (ref 70–99)

## 2012-06-20 MED ORDER — HYDROMORPHONE HCL PF 1 MG/ML IJ SOLN
INTRAMUSCULAR | Status: AC
  Administered 2012-06-20: 1 mg
  Filled 2012-06-20: qty 1

## 2012-06-20 MED ORDER — HYDROCODONE-ACETAMINOPHEN 5-325 MG PO TABS: 1.0000 | ORAL_TABLET | ORAL | Status: DC | PRN

## 2012-06-20 MED ORDER — HYDROCODONE-ACETAMINOPHEN 5-325 MG PO TABS
1.0000 | ORAL_TABLET | ORAL | Status: DC | PRN
  Administered 2012-06-20: 2 via ORAL
  Filled 2012-06-20: qty 2

## 2012-06-20 MED ORDER — HYDROMORPHONE HCL PF 1 MG/ML IJ SOLN
INTRAMUSCULAR | Status: AC
  Filled 2012-06-20: qty 1

## 2012-06-20 MED ORDER — HYDROMORPHONE HCL PF 1 MG/ML IJ SOLN
1.0000 mg | INTRAMUSCULAR | Status: DC | PRN
  Administered 2012-06-20: 1 mg via INTRAVENOUS

## 2012-06-20 NOTE — ED Provider Notes (Signed)
Medical screening examination/treatment/procedure(s) were performed by non-physician practitioner and as supervising physician I was immediately available for consultation/collaboration.  Derwood Kaplan, MD 06/20/12 0040

## 2012-06-20 NOTE — Progress Notes (Signed)
Patient discharged to home with instructions and verbalized understanding. 

## 2012-06-20 NOTE — Discharge Summary (Signed)
  Patient ID: [name] MRN: [id number] DOB/AGE: [birth year] 35 y.o.  Admit date: 06/19/2012 Discharge date: 06/20/2012  Procedures: laparoscopic appendectomy  Consults: None  Reason for Admission: This is a 36 yo female who was admitted secondary to RLQ pain c/w acute appendicitis  Admission Diagnoses:  1. Acute appendicitis 2. DM  Hospital Course: The patient was admitted and taken to the operating room where she underwent a lap appy.  She tolerated the procedure well.  She was tolerating a regular diet and her pain was well controlled on POD# 1.  She was felt stable for dc home.  Discharge Diagnoses:  1. Acute appendicitis 2. S/p lap appy  Discharge Medications:   Medication List     As of 06/20/2012 10:23 AM    TAKE these medications         HYDROcodone-acetaminophen 5-325 MG per tablet   Commonly known as: NORCO/VICODIN   Take 1-2 tablets by mouth every 4 (four) hours as needed.      ibuprofen 200 MG tablet   Commonly known as: ADVIL,MOTRIN   Take 800 mg by mouth 2 (two) times daily as needed. For pain        Discharge Instructions:     Follow-up Information    Follow up with [facility]. On 07/05/2012. (3:00pm, arrive at 2:30pm)    Contact information:   [address]   [place] [address] [phone]          Signed: Letha Cape 06/20/2012, 10:23 AM

## 2012-06-21 NOTE — Anesthesia Postprocedure Evaluation (Signed)
  Anesthesia Post-op Note  Patient: [name]  Procedure(s) Performed: Procedure(s) (LRB) with comments: APPENDECTOMY LAPAROSCOPIC (N/A)  Patient Location: PACU  Anesthesia Type:General  Level of Consciousness: awake, alert , oriented and patient cooperative  Airway and Oxygen Therapy: Patient Spontanous Breathing and Patient connected to nasal cannula oxygen  Post-op Pain: mild  Post-op Assessment: Post-op Vital signs reviewed, Patient's Cardiovascular Status Stable, Respiratory Function Stable, Patent Airway, No signs of Nausea or vomiting and Pain level controlled  Post-op Vital Signs: stable  Complications: No apparent anesthesia complications

## 2012-07-05 ENCOUNTER — Encounter (INDEPENDENT_AMBULATORY_CARE_PROVIDER_SITE_OTHER): Payer: BC Managed Care – PPO

## 2012-07-06 ENCOUNTER — Ambulatory Visit: Payer: BC Managed Care – PPO | Admitting: Cardiovascular Disease

## 2012-09-07 ENCOUNTER — Ambulatory Visit (INDEPENDENT_AMBULATORY_CARE_PROVIDER_SITE_OTHER): Payer: BC Managed Care – PPO | Admitting: Family Medicine

## 2012-09-07 VITALS — BP 143/87 | HR 99 | Temp 98.4°F | Resp 16 | Ht 63.0 in | Wt 205.4 lb

## 2012-09-07 DIAGNOSIS — J069 Acute upper respiratory infection, unspecified: Secondary | ICD-10-CM

## 2012-09-07 DIAGNOSIS — J029 Acute pharyngitis, unspecified: Secondary | ICD-10-CM

## 2012-09-07 DIAGNOSIS — R05 Cough: Secondary | ICD-10-CM

## 2012-09-07 DIAGNOSIS — R059 Cough, unspecified: Secondary | ICD-10-CM

## 2012-09-07 LAB — POCT RAPID STREP A (OFFICE): Rapid Strep A Screen: NEGATIVE

## 2012-09-07 MED ORDER — HYDROCODONE-HOMATROPINE 5-1.5 MG/5ML PO SYRP: 5.0000 mL | ORAL_SOLUTION | Freq: Every evening | ORAL | Status: DC | PRN

## 2012-09-07 MED ORDER — LEVOFLOXACIN 500 MG PO TABS: 500.0000 mg | ORAL_TABLET | Freq: Every day | ORAL | Status: DC

## 2012-09-07 MED ORDER — BENZONATATE 100 MG PO CAPS: 200.0000 mg | ORAL_CAPSULE | Freq: Two times a day (BID) | ORAL | Status: AC | PRN

## 2012-09-07 NOTE — Progress Notes (Signed)
[facility]:  Office Visit  Chief Complaint:  Chief Complaint  Patient presents with  . Cough    productive- green sputum  . Sore Throat  . Fever    HPI: [name] is a 37 y.o. female who complains of  Fever, cough, productive green sputum, sore throat. Tmax 101.5.+ Sinus pressure/pain. No ear pain. Has taken Tylenol. No msk aches. Does not feel like flu. Her son was taken to pediatrician and dx with walking PNA.   Past Medical History  Diagnosis Date  . Thyroid disease   . Diabetes mellitus without complication   . Arthritis    Past Surgical History  Procedure Date  . Joint replacement   . Cesarean section   . Laparoscopic appendectomy 06/19/2012    Procedure: APPENDECTOMY LAPAROSCOPIC;  Surgeon: Clovis Pu. Cornett, MD;  Location: [facility] OR;  Service: General;  Laterality: N/A;  . Appendectomy    History   Social History  . Marital Status: Divorced    Spouse Name: N/A    Number of Children: N/A  . Years of Education: N/A   Social History Main Topics  . Smoking status: Never Smoker   . Smokeless tobacco: None  . Alcohol Use: No  . Drug Use: No  . Sexually Active: Yes    Birth Control/ Protection: IUD   Other Topics Concern  . None   Social History Narrative  . None   Family History  Problem Relation Age of Onset  . Hypertension Mother   . Hypertension Brother   . Heart disease Maternal Grandmother    No Known Allergies Prior to Admission medications   Medication Sig Start Date End Date Taking? Authorizing Provider  ibuprofen (ADVIL,MOTRIN) 200 MG tablet Take 800 mg by mouth 2 (two) times daily as needed. For pain   Yes Historical Provider, MD  HYDROcodone-acetaminophen (NORCO/VICODIN) 5-325 MG per tablet Take 1-2 tablets by mouth every 4 (four) hours as needed. 06/20/12   Letha Cape, PA     ROS: The patient denies night sweats, unintentional weight loss, chest pain, palpitations, wheezing, dyspnea on exertion, nausea,  vomiting, abdominal pain, dysuria, hematuria, melena, numbness, weakness, or tingling.   All other systems have been reviewed and were otherwise negative with the exception of those mentioned in the HPI and as above.    PHYSICAL EXAM: Filed Vitals:   09/07/12 1307  BP: 143/87  Pulse: 99  Temp: 98.4 F (36.9 C)  Resp: 16   Filed Vitals:   09/07/12 1307  Height: 5\' 3"  (1.6 m)  Weight: 205 lb 6.4 oz (93.169 kg)   Body mass index is 36.39 kg/(m^2).  General: Alert, no acute distress HEENT:  Normocephalic, atraumatic, oropharynx patent. TM nl. No exudates. + sinus tenderness bilateral maxillary Cardiovascular:  Regular rate and rhythm, no rubs murmurs or gallops.  No Carotid bruits, radial pulse intact. No pedal edema.  Respiratory: Clear to auscultation bilaterally.  No wheezes, rales, or rhonchi.  No cyanosis, no use of accessory musculature GI: No organomegaly, abdomen is soft and non-tender, positive bowel sounds.  No masses. Skin: No rashes. Neurologic: Facial musculature symmetric. Psychiatric: Patient is appropriate throughout our interaction. Lymphatic: No cervical lymphadenopathy Musculoskeletal: Gait intact.   LABS: Results for orders placed in visit on 09/07/12  POCT RAPID STREP A (OFFICE)      Component Value Range   Rapid Strep A Screen Negative  Negative     EKG/XRAY:   Primary read interpreted by Dr. Conley Rolls at  [facility].   ASSESSMENT/PLAN: Encounter Diagnoses  Name Primary?  . Pharyngitis Yes  . URI (upper respiratory infection)   . Cough    Acute sinusitis  Will treat with Levaquin, son has walking PNA Rx Levaquin, [name], Hydromet F/u prn   LE, THAO PHUONG, DO 09/07/2012 4:01 PM

## 2012-09-10 LAB — CULTURE, GROUP A STREP: Organism ID, Bacteria: NORMAL

## 2012-09-14 ENCOUNTER — Telehealth: Payer: Self-pay | Admitting: Family Medicine

## 2012-09-14 NOTE — Telephone Encounter (Signed)
Pt called back and I gave her neg strep results. Pt reported that she is feeling great now.

## 2012-09-14 NOTE — Telephone Encounter (Signed)
Left message on machine for patient to call back.

## 2012-09-14 NOTE — Telephone Encounter (Signed)
Message copied by Honor Loh on Wed Sep 14, 2012  9:40 AM ------      Message from: Lenell Antu      Created: Tue Sep 13, 2012  4:02 PM       Let her know she does not have strep, c/w plan            thx      Tle

## 2012-10-08 ENCOUNTER — Other Ambulatory Visit: Payer: Self-pay

## 2013-06-29 ENCOUNTER — Other Ambulatory Visit: Payer: Self-pay

## 2014-04-25 ENCOUNTER — Ambulatory Visit (INDEPENDENT_AMBULATORY_CARE_PROVIDER_SITE_OTHER): Payer: BC Managed Care – PPO

## 2014-04-25 ENCOUNTER — Ambulatory Visit (INDEPENDENT_AMBULATORY_CARE_PROVIDER_SITE_OTHER): Payer: BC Managed Care – PPO | Admitting: Family Medicine

## 2014-04-25 VITALS — BP 138/96 | HR 82 | Temp 98.0°F | Resp 16 | Ht 63.0 in | Wt 208.6 lb

## 2014-04-25 DIAGNOSIS — M5430 Sciatica, unspecified side: Secondary | ICD-10-CM

## 2014-04-25 DIAGNOSIS — E039 Hypothyroidism, unspecified: Secondary | ICD-10-CM | POA: Insufficient documentation

## 2014-04-25 DIAGNOSIS — M5431 Sciatica, right side: Secondary | ICD-10-CM

## 2014-04-25 DIAGNOSIS — E282 Polycystic ovarian syndrome: Secondary | ICD-10-CM | POA: Insufficient documentation

## 2014-04-25 LAB — POCT URINE PREGNANCY: PREG TEST UR: NEGATIVE

## 2014-04-25 MED ORDER — TRAMADOL HCL 50 MG PO TABS: 50.0000 mg | ORAL_TABLET | Freq: Two times a day (BID) | ORAL | Status: DC | PRN

## 2014-04-25 MED ORDER — PREDNISONE 10 MG PO TABS: ORAL_TABLET | ORAL | Status: DC

## 2014-04-25 MED ORDER — TIZANIDINE HCL 4 MG PO TABS: 4.0000 mg | ORAL_TABLET | Freq: Three times a day (TID) | ORAL | Status: DC | PRN

## 2014-04-25 NOTE — Progress Notes (Signed)
Xray read and patient discussed with Dr. Tammy Sours. Agree with assessment and plan of care per her note.   [facility] reading (PRIMARY) by  Dr. Neva Seat: pelvis/hip: no acute bony findings.  Prior surgical hardware intact.

## 2014-04-25 NOTE — Progress Notes (Addendum)
  [name] - 38 y.o. female MRN [id number]  Date of birth: October 11, 1975  SUBJECTIVE:  Including CC & ROS.  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  Relieving factors: Patient can not find a comfortable position but reports once standing she feels better. She has been taking Advil  daily with minimal relief Worsened by:  Getting up from a sitting position, standing for a long period of time   ROS:  Constitutional:  No fever, chills, or fatigue.  Respiratory:  No shortness of breath, cough, or wheezing Cardiovascular:  No palpitations, chest pain or syncope Gastrointestinal:  No nausea, no abdominal pain Review of systems otherwise negative except for what is stated in HPI  HISTORY: Past Medical, Surgical, Social, and Family History Reviewed & Updated per EMR. 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 Vascular: radial pulses 2+ bilaterally Neurologically: Sensation to light touch lower extremities equal and intact bilaterally.  Observation: Normal curvature and no kyphosis or lordosis, no scoliosis.  Iliac crests are symmetric, shoulders line symmetrically Palpation:  No step  off defects noted in the thoracic or lumbar spine.   No muscle spasm or tenderness along the paraspinal musculature of the thoracic Mild muscle spasm or tenderness along the paraspinal musculature of the lumbar spine and in External Rotator muscle of the hip Range of motion:  Limited flexion on forward bending, no pain with extension, no pain with one leg hyperextension. Neuromuscular: No radiculopathy but pain with straight leg raise left or right, pain for hunch over gait walking with normal walking on heels or walking on toes Nerve root intervention  L2 and L3: Normal hip flexion with no weakness. L2, L3, L4: Normal hip abduction bilaterally.  Normal patellar DTR +3 bilaterally L4, L5, S1: Normal hip abduction bilaterally S1 and S2: Normal ankle plantar-flexion bilaterally.  Normal Achilles tendon DTR +3 bilaterally L5: Normal extensor hallucis longus bilaterally  Xray: Bilateral hip and pelvic 2 view:  Normal alignment and position of bilateral hip joint replacement prosthesis. Normal disc space of lumbar spine. No fractures  Pregnancy test negative  ASSESSMENT & PLAN:  Right Sided Sciatica Neuralgia and hip external rotator muscle spasming  Recommend: Continue mobility no bedrest Muscle Relaxer for muscle spasm Prednisone for neuralgias and inflammation Tramadol for severe pain ROM and stretch exercise for the hip and low back

## 2014-08-24 DIAGNOSIS — A6000 Herpesviral infection of urogenital system, unspecified: Secondary | ICD-10-CM

## 2014-08-24 HISTORY — DX: Herpesviral infection of urogenital system, unspecified: A60.00

## 2014-10-23 ENCOUNTER — Encounter (HOSPITAL_COMMUNITY): Payer: Self-pay | Admitting: *Deleted

## 2014-10-23 ENCOUNTER — Inpatient Hospital Stay (HOSPITAL_COMMUNITY)
Admission: AD | Admit: 2014-10-23 | Discharge: 2014-10-24 | Disposition: A | Discharge status: Home / Self Care | Payer: BC Managed Care – PPO | Source: Ambulatory Visit | Attending: Obstetrics & Gynecology | Admitting: Obstetrics & Gynecology

## 2014-10-23 DIAGNOSIS — N9089 Other specified noninflammatory disorders of vulva and perineum: Secondary | ICD-10-CM | POA: Diagnosis not present

## 2014-10-23 DIAGNOSIS — E119 Type 2 diabetes mellitus without complications: Secondary | ICD-10-CM | POA: Insufficient documentation

## 2014-10-23 DIAGNOSIS — R32 Unspecified urinary incontinence: Secondary | ICD-10-CM | POA: Insufficient documentation

## 2014-10-23 DIAGNOSIS — Z3202 Encounter for pregnancy test, result negative: Secondary | ICD-10-CM | POA: Insufficient documentation

## 2014-10-23 DIAGNOSIS — N949 Unspecified condition associated with female genital organs and menstrual cycle: Secondary | ICD-10-CM | POA: Insufficient documentation

## 2014-10-23 DIAGNOSIS — N390 Urinary tract infection, site not specified: Secondary | ICD-10-CM

## 2014-10-23 LAB — URINALYSIS, ROUTINE W REFLEX MICROSCOPIC
Bilirubin Urine: NEGATIVE
Glucose, UA: NEGATIVE mg/dL
KETONES UR: 15 mg/dL — AB
NITRITE: POSITIVE — AB
PH: 5 (ref 5.0–8.0)
PROTEIN: NEGATIVE mg/dL
Specific Gravity, Urine: >1.03 — ABNORMAL HIGH (ref 1.005–1.030)
Urobilinogen, UA: 0.2 mg/dL (ref 0.0–1.0)

## 2014-10-23 LAB — URINE MICROSCOPIC-ADD ON

## 2014-10-23 LAB — POCT PREGNANCY, URINE: Preg Test, Ur: NEGATIVE

## 2014-10-23 LAB — WET PREP, GENITAL
Clue Cells Wet Prep HPF POC: NONE SEEN
Trich, Wet Prep: NONE SEEN
Yeast Wet Prep HPF POC: NONE SEEN

## 2014-10-23 MED ORDER — VALACYCLOVIR HCL 1 G PO TABS: 1000.0000 mg | ORAL_TABLET | Freq: Two times a day (BID) | ORAL | Status: DC

## 2014-10-23 MED ORDER — LIDOCAINE HCL 2 % EX GEL
Freq: Once | CUTANEOUS | Status: AC
  Administered 2014-10-24: 5 via TOPICAL
  Filled 2014-10-23: qty 5

## 2014-10-23 MED ORDER — SULFAMETHOXAZOLE-TRIMETHOPRIM 800-160 MG PO TABS: 1.0000 | ORAL_TABLET | Freq: Two times a day (BID) | ORAL | Status: DC

## 2014-10-23 NOTE — MAU Provider Note (Signed)
Chief Complaint: Vaginal Pain  First Provider Initiated Contact with Patient 10/23/14 2220      SUBJECTIVE HPI: [name] is a 39 y.o. G2P2 female who presents with: 1. Dysuria since x 4 days (since 2/28)  2. Severe vaginal and vulvar pain since yesterday.  3. Increased frequency & incontinence today  Primary concern is vaginal/vulvar pain. No Hx of similar Sx. Slightly increase vaginal discharge and odor x a few days when asked. Currently on menstrual period. Denies fever, chills, abd pain. Pt is divorced and currently engaged.   Past Medical History  Diagnosis Date  . Thyroid disease   . Diabetes mellitus without complication   . Arthritis    OB History  Gravida Para Term Preterm AB SAB TAB Ectopic Multiple Living  2             # Outcome Date GA Lbr Len/2nd Weight Sex Delivery Anes PTL Lv  2 Gravida           1 Gravida              Past Surgical History  Procedure Laterality Date  . Joint replacement    . Cesarean section    . Laparoscopic appendectomy  06/19/2012    Procedure: APPENDECTOMY LAPAROSCOPIC;  Surgeon: Clovis Pu. Cornett, MD;  Location: [facility] OR;  Service: General;  Laterality: N/A;  . Appendectomy     History   Social History  . Marital Status: Divorced    Spouse Name: N/A  . Number of Children: N/A  . Years of Education: N/A   Occupational History  . Not on file.   Social History Main Topics  . Smoking status: Never Smoker   . Smokeless tobacco: Not on file  . Alcohol Use: No  . Drug Use: No  . Sexual Activity: Yes    Birth Control/ Protection: IUD   Other Topics Concern  . Not on file   Social History Narrative   No current facility-administered medications on file prior to encounter.   Current Outpatient Prescriptions on File Prior to Encounter  Medication Sig Dispense Refill  . ibuprofen (ADVIL,MOTRIN) 200 MG tablet Take 800 mg by mouth 2 (two) times daily as needed. For pain    . levothyroxine (SYNTHROID, LEVOTHROID) 50 MCG  tablet Take 50 mcg by mouth daily before breakfast.    . predniSONE (DELTASONE) 10 MG tablet 5 tabs PO for 2 days, 4 tabs for 2 days, 3 tabs for 2 days, 1 tab for 2 days, 26 tablet 0  . tiZANidine (ZANAFLEX) 4 MG tablet Take 1 tablet (4 mg total) by mouth every 8 (eight) hours as needed for muscle spasms. 30 tablet 0  . traMADol (ULTRAM) 50 MG tablet Take 1 tablet (50 mg total) by mouth every 12 (twelve) hours as needed for severe pain. 30 tablet 0   No Known Allergies  Review of Systems  Constitutional: Negative for fever and chills.  Gastrointestinal: Positive for nausea. Negative for vomiting, abdominal pain and diarrhea.  Genitourinary: Positive for dysuria, urgency and frequency. Negative for hematuria and flank pain.  Musculoskeletal: Negative for myalgias and back pain.  Skin: Positive for itching (mild vulvar).  Neg for dyspareunia.   OBJECTIVE Blood pressure 143/96, pulse 98, temperature 98 F (36.7 C), temperature source Oral, resp. rate 20, height  (1.575 m), weight 91.4 kg (201 lb 8 oz), last menstrual period 10/20/2014, SpO2 100 %. GENERAL: Well-developed, well-nourished obese female in mild distress. Severe distress w/ vulvar exam.  HEENT: Normocephalic HEART: normal rate RESP: normal effort ABDOMEN: Soft, non-tender. No CVAT.  EXTREMITIES: Nontender, no edema NEURO: Alert and oriented SPECULUM EXAM: Extremely tender at labial cleft and posterior fourchette. Some distinctly circular lesions C/W popped vesicles. Other areas of coalesced vesicles vs excoriated skin, moderate amount of menstrual blood noted, No discharge visible. cervix clean, but incompletely visualized to to pt intolerance of exam.  BIMANUAL: Declined  LAB RESULTS Results for orders placed or performed during the hospital encounter of 10/23/14 (from the past 24 hour(s))  Urinalysis, Routine w reflex microscopic     Status: Abnormal   Collection Time: 10/23/14  9:11 PM  Result Value Ref Range    Color, Urine YELLOW YELLOW   APPearance HAZY (A) CLEAR   Specific Gravity, Urine >1.030 (H) 1.005 - 1.030   pH 5.0 5.0 - 8.0   Glucose, UA NEGATIVE NEGATIVE mg/dL   Hgb urine dipstick LARGE (A) NEGATIVE   Bilirubin Urine NEGATIVE NEGATIVE   Ketones, ur 15 (A) NEGATIVE mg/dL   Protein, ur NEGATIVE NEGATIVE mg/dL   Urobilinogen, UA 0.2 0.0 - 1.0 mg/dL   Nitrite POSITIVE (A) NEGATIVE   Leukocytes, UA SMALL (A) NEGATIVE  Urine microscopic-add on     Status: Abnormal   Collection Time: 10/23/14  9:11 PM  Result Value Ref Range   Squamous Epithelial / LPF RARE RARE   WBC, UA 3-6 <3 WBC/hpf   RBC / HPF 21-50 <3 RBC/hpf   Bacteria, UA RARE RARE   Crystals CA OXALATE CRYSTALS (A) NEGATIVE  Pregnancy, urine POC     Status: None   Collection Time: 10/23/14  9:22 PM  Result Value Ref Range   Preg Test, Ur NEGATIVE NEGATIVE    IMAGING No results found.  MAU COURSE Wet prep, HVS culture.   Care of pt turned over to Vonzella NippleJulie Wenzel, [place] at 2325. Dr. Langston MaskerMorris notified of pt Sx and exam. Agrees w/ POC. Will offer pt Valtrex in case it is HSV    [place] N[place] Jared, CNM 10/23/2014  10:20 PM

## 2014-10-23 NOTE — Progress Notes (Signed)
Pt states she was given nausea medication. Pt states she took medication yesterday. Pt states she was having both nasuea and diarrhea yesterday but not diarrhea today.pt states she feels nauseous today but has not taken any nausea medication

## 2014-10-23 NOTE — Discharge Instructions (Signed)
Urinary Tract Infection Urinary tract infections (UTIs) can develop anywhere along your urinary tract. Your urinary tract is your body's drainage system for removing wastes and extra water. Your urinary tract includes two kidneys, two ureters, a bladder, and a urethra. Your kidneys are a pair of bean-shaped organs. Each kidney is about the size of your fist. They are located below your ribs, one on each side of your spine. CAUSES Infections are caused by microbes, which are microscopic organisms, including fungi, viruses, and bacteria. These organisms are so small that they can only be seen through a microscope. Bacteria are the microbes that most commonly cause UTIs. SYMPTOMS  Symptoms of UTIs may vary by age and gender of the patient and by the location of the infection. Symptoms in young women typically include a frequent and intense urge to urinate and a painful, burning feeling in the bladder or urethra during urination. Older women and men are more likely to be tired, shaky, and weak and have muscle aches and abdominal pain. A fever may mean the infection is in your kidneys. Other symptoms of a kidney infection include pain in your back or sides below the ribs, nausea, and vomiting. DIAGNOSIS To diagnose a UTI, your caregiver will ask you about your symptoms. Your caregiver also will ask to provide a urine sample. The urine sample will be tested for bacteria and white blood cells. White blood cells are made by your body to help fight infection. TREATMENT  Typically, UTIs can be treated with medication. Because most UTIs are caused by a bacterial infection, they usually can be treated with the use of antibiotics. The choice of antibiotic and length of treatment depend on your symptoms and the type of bacteria causing your infection. HOME CARE INSTRUCTIONS  If you were prescribed antibiotics, take them exactly as your caregiver instructs you. Finish the medication even if you feel better after you  have only taken some of the medication.  Drink enough water and fluids to keep your urine clear or pale yellow.  Avoid caffeine, tea, and carbonated beverages. They tend to irritate your bladder.  Empty your bladder often. Avoid holding urine for long periods of time.  Empty your bladder before and after sexual intercourse.  After a bowel movement, women should cleanse from front to back. Use each tissue only once. SEEK MEDICAL CARE IF:   You have back pain.  You develop a fever.  Your symptoms do not begin to resolve within 3 days. SEEK IMMEDIATE MEDICAL CARE IF:   You have severe back pain or lower abdominal pain.  You develop chills.  You have nausea or vomiting.  You have continued burning or discomfort with urination. MAKE SURE YOU:   Understand these instructions.  Will watch your condition.  Will get help right away if you are not doing well or get worse. Document Released: 05/20/2005 Document Revised: 02/09/2012 Document Reviewed: 09/18/2011 [facility] Patient Information 2015 [place]. This information is not intended to replace advice given to you by your health care provider. Make sure you discuss any questions you have with your health care provider.  Genital Herpes Genital herpes is a sexually transmitted disease. This means that it is a disease passed by having sex with an infected person. There is no cure for genital herpes. The time between attacks can be months to years. The virus may live in a person but produce no problems (symptoms). This infection can be passed to a baby as it travels down the birth  canal (vagina). In a newborn, this can cause central nervous system damage, eye damage, or even death. The virus that causes genital herpes is usually HSV-2 virus. The virus that causes oral herpes is usually HSV-1. The diagnosis (learning what is wrong) is made through culture results. SYMPTOMS  Usually symptoms of pain and itching begin a few days to a  week after contact. It first appears as small blisters that progress to small painful ulcers which then scab over and heal after several days. It affects the outer genitalia, birth canal, cervix, penis, anal area, buttocks, and thighs. HOME CARE INSTRUCTIONS   Keep ulcerated areas dry and clean.  Take medications as directed. Antiviral medications can speed up healing. They will not prevent recurrences or cure this infection. These medications can also be taken for suppression if there are frequent recurrences.  While the infection is active, it is contagious. Avoid all sexual contact during active infections.  Condoms may help prevent spread of the herpes virus.  Practice safe sex.  Wash your hands thoroughly after touching the genital area.  Avoid touching your eyes after touching your genital area.  Inform your caregiver if you have had genital herpes and become pregnant. It is your responsibility to insure a safe outcome for your baby in this pregnancy.  Only take over-the-counter or prescription medicines for pain, discomfort, or fever as directed by your caregiver. SEEK MEDICAL CARE IF:   You have a recurrence of this infection.  You do not respond to medications and are not improving.  You have new sources of pain or discharge which have changed from the original infection.  You have an oral temperature above 102 F (38.9 C).  You develop abdominal pain.  You develop eye pain or signs of eye infection. Document Released: 08/07/2000 Document Revised: 11/02/2011 Document Reviewed: 08/28/2009 [facility] Patient Information 2015 [place], [place]. This information is not intended to replace advice given to you by your health care provider. Make sure you discuss any questions you have with your health care provider.

## 2014-10-23 NOTE — MAU Note (Signed)
Vaginal pain; evaluated at urgent care yesterday & diagnosed with yeast infection and BV. Taking flagyl & diflucan, states pain is worse. Currently on period. Vaginal discharge; unsure what it looks like, states it's mixed with her blood. Dysuria since Saturday night; increased frequency & incontinence today. Denies abdominal pain.

## 2014-10-23 NOTE — MAU Note (Signed)
Pt states she has been having severe pain in her vagina. Pt was seen in the urgent care and the dr put her on Flagyl and Diflucan yesterday without doing an exam.. Pt states she took both medications and has had not relief.

## 2014-10-24 MED ORDER — SULFAMETHOXAZOLE-TRIMETHOPRIM 800-160 MG PO TABS: 1.0000 | ORAL_TABLET | Freq: Two times a day (BID) | ORAL | Status: DC

## 2014-10-24 MED ORDER — VALACYCLOVIR HCL 1 G PO TABS: 1000.0000 mg | ORAL_TABLET | Freq: Two times a day (BID) | ORAL | Status: AC

## 2014-10-25 ENCOUNTER — Inpatient Hospital Stay (EMERGENCY_DEPARTMENT_HOSPITAL)
Admission: AD | Admit: 2014-10-25 | Discharge: 2014-10-25 | Disposition: A | Discharge status: Home / Self Care | Payer: BC Managed Care – PPO | Source: Ambulatory Visit | Attending: Obstetrics and Gynecology | Admitting: Obstetrics and Gynecology

## 2014-10-25 ENCOUNTER — Emergency Department (HOSPITAL_COMMUNITY): Payer: BC Managed Care – PPO

## 2014-10-25 ENCOUNTER — Encounter (HOSPITAL_COMMUNITY): Payer: Self-pay | Admitting: *Deleted

## 2014-10-25 ENCOUNTER — Encounter (HOSPITAL_COMMUNITY): Payer: Self-pay

## 2014-10-25 ENCOUNTER — Emergency Department (HOSPITAL_COMMUNITY)
Admission: EM | Admit: 2014-10-25 | Discharge: 2014-10-26 | Disposition: A | Discharge status: Home / Self Care | Payer: BC Managed Care – PPO | Attending: Emergency Medicine | Admitting: Emergency Medicine

## 2014-10-25 DIAGNOSIS — R110 Nausea: Secondary | ICD-10-CM | POA: Insufficient documentation

## 2014-10-25 DIAGNOSIS — M1990 Unspecified osteoarthritis, unspecified site: Secondary | ICD-10-CM | POA: Insufficient documentation

## 2014-10-25 DIAGNOSIS — Z87440 Personal history of urinary (tract) infections: Secondary | ICD-10-CM | POA: Insufficient documentation

## 2014-10-25 DIAGNOSIS — E079 Disorder of thyroid, unspecified: Secondary | ICD-10-CM | POA: Insufficient documentation

## 2014-10-25 DIAGNOSIS — R1011 Right upper quadrant pain: Secondary | ICD-10-CM

## 2014-10-25 DIAGNOSIS — E119 Type 2 diabetes mellitus without complications: Secondary | ICD-10-CM | POA: Insufficient documentation

## 2014-10-25 DIAGNOSIS — Z9889 Other specified postprocedural states: Secondary | ICD-10-CM | POA: Diagnosis not present

## 2014-10-25 DIAGNOSIS — Z792 Long term (current) use of antibiotics: Secondary | ICD-10-CM | POA: Diagnosis not present

## 2014-10-25 DIAGNOSIS — Z79899 Other long term (current) drug therapy: Secondary | ICD-10-CM | POA: Insufficient documentation

## 2014-10-25 DIAGNOSIS — Z8619 Personal history of other infectious and parasitic diseases: Secondary | ICD-10-CM | POA: Diagnosis not present

## 2014-10-25 DIAGNOSIS — Z791 Long term (current) use of non-steroidal anti-inflammatories (NSAID): Secondary | ICD-10-CM | POA: Insufficient documentation

## 2014-10-25 DIAGNOSIS — K8020 Calculus of gallbladder without cholecystitis without obstruction: Secondary | ICD-10-CM | POA: Diagnosis not present

## 2014-10-25 HISTORY — DX: Endometriosis, unspecified: N80.9

## 2014-10-25 HISTORY — DX: Other specified congenital deformities of hip: Q65.89

## 2014-10-25 HISTORY — DX: Herpesviral infection of urogenital system, unspecified: A60.00

## 2014-10-25 HISTORY — DX: Unspecified infectious disease: B99.9

## 2014-10-25 LAB — CBC WITH DIFFERENTIAL/PLATELET
BASOS ABS: 0 10*3/uL (ref 0.0–0.1)
Basophils Relative: 1 % (ref 0–1)
EOS PCT: 2 % (ref 0–5)
Eosinophils Absolute: 0.1 10*3/uL (ref 0.0–0.7)
HCT: 41.2 % (ref 36.0–46.0)
Hemoglobin: 14.1 g/dL (ref 12.0–15.0)
LYMPHS ABS: 2.9 10*3/uL (ref 0.7–4.0)
LYMPHS PCT: 43 % (ref 12–46)
MCH: 30 pg (ref 26.0–34.0)
MCHC: 34.2 g/dL (ref 30.0–36.0)
MCV: 87.7 fL (ref 78.0–100.0)
Monocytes Absolute: 0.3 10*3/uL (ref 0.1–1.0)
Monocytes Relative: 5 % (ref 3–12)
NEUTROS ABS: 3.3 10*3/uL (ref 1.7–7.7)
NEUTROS PCT: 50 % (ref 43–77)
PLATELETS: 275 10*3/uL (ref 150–400)
RBC: 4.7 MIL/uL (ref 3.87–5.11)
RDW: 13.2 % (ref 11.5–15.5)
WBC: 6.6 10*3/uL (ref 4.0–10.5)

## 2014-10-25 LAB — HERPES SIMPLEX VIRUS CULTURE: Culture: DETECTED

## 2014-10-25 LAB — COMPREHENSIVE METABOLIC PANEL
ALBUMIN: 4.1 g/dL (ref 3.5–5.2)
ALT: 40 U/L — ABNORMAL HIGH (ref 0–35)
AST: 41 U/L — AB (ref 0–37)
Alkaline Phosphatase: 65 U/L (ref 39–117)
Anion gap: 7 (ref 5–15)
BUN: 12 mg/dL (ref 6–23)
CALCIUM: 9.1 mg/dL (ref 8.4–10.5)
CHLORIDE: 107 mmol/L (ref 96–112)
CO2: 24 mmol/L (ref 19–32)
Creatinine, Ser: 1.12 mg/dL — ABNORMAL HIGH (ref 0.50–1.10)
GFR calc Af Amer: 70 mL/min — ABNORMAL LOW (ref >90)
GFR, EST NON AFRICAN AMERICAN: 61 mL/min — AB (ref >90)
Glucose, Bld: 200 mg/dL — ABNORMAL HIGH (ref 70–99)
POTASSIUM: 4.3 mmol/L (ref 3.5–5.1)
Sodium: 138 mmol/L (ref 135–145)
TOTAL PROTEIN: 7.6 g/dL (ref 6.0–8.3)
Total Bilirubin: 0.8 mg/dL (ref 0.3–1.2)

## 2014-10-25 LAB — LIPASE, BLOOD: LIPASE: 31 U/L (ref 11–59)

## 2014-10-25 MED ORDER — ONDANSETRON 8 MG PO TBDP
8.0000 mg | ORAL_TABLET | Freq: Once | ORAL | Status: AC
  Administered 2014-10-25: 8 mg via ORAL
  Filled 2014-10-25: qty 1

## 2014-10-25 MED ORDER — MORPHINE SULFATE 4 MG/ML IJ SOLN
4.0000 mg | Freq: Once | INTRAMUSCULAR | Status: AC
  Administered 2014-10-25: 4 mg via INTRAVENOUS
  Filled 2014-10-25: qty 1

## 2014-10-25 MED ORDER — SODIUM CHLORIDE 0.9 % IV BOLUS (SEPSIS)
2000.0000 mL | Freq: Once | INTRAVENOUS | Status: AC
  Administered 2014-10-25: 2000 mL via INTRAVENOUS

## 2014-10-25 MED ORDER — ONDANSETRON HCL 4 MG/2ML IJ SOLN
4.0000 mg | Freq: Once | INTRAMUSCULAR | Status: AC
  Administered 2014-10-25: 4 mg via INTRAVENOUS
  Filled 2014-10-25: qty 2

## 2014-10-25 NOTE — ED Provider Notes (Signed)
CSN: [id number]     Arrival date & time 10/25/14  1959 History   First MD Initiated Contact with Patient 10/25/14 2055     Chief Complaint  Patient presents with  . Abdominal Pain     (Consider location/radiation/quality/duration/timing/severity/associated sxs/prior Treatment) The history is provided by the patient and medical records. No language interpreter was used.     [name] is a 39 y.o. female  with a hx of NIDDM, hypothyroid, arthritis presents to the Emergency Department complaining of waxing and waning progressively worsening RUQ abd pain onset 5 days ago.  Pt reports pain occurs after eating or drinking anything. Pt reports she feels much better when she is not eating.  She reports she has had nausea after eating for several weeks.  Associated symptoms include nausea without vomiting.  Pt reports loose stools throughout the week without melena or hematochezia.  Pt denies fever, chills, headache, neck pain, chest pain, SOB, weakness, dizziness, syncope, hematuria.  Pt reports she has dysuria at this time but is currently being treated for a UTI an active herpes outbreak.  Abd surgeries include appendectomy and c-section x2.     Past Medical History  Diagnosis Date  . Thyroid disease   . Diabetes mellitus without complication   . Arthritis   . Infection     UTI  . Congenital hip dysplasia   . Genital herpes   . Endometriosis    Past Surgical History  Procedure Laterality Date  . Cesarean section    . Laparoscopic appendectomy  06/19/2012    Procedure: APPENDECTOMY LAPAROSCOPIC;  Surgeon: Clovis Pu. Cornett, MD;  Location: [facility] OR;  Service: General;  Laterality: N/A;  . Appendectomy    . Joint replacement      rt and left hip   Family History  Problem Relation Age of Onset  . Hypertension Mother   . Hypertension Brother   . Heart disease Maternal Grandmother    History  Substance Use Topics  . Smoking status: Never Smoker   . Smokeless tobacco: Never Used   . Alcohol Use: No   OB History    Gravida Para Term Preterm AB TAB SAB Ectopic Multiple Living   0 0 0 0 0 2     Review of Systems  Constitutional: Negative for fever, diaphoresis, appetite change, fatigue and unexpected weight change.  HENT: Negative for mouth sores.   Eyes: Negative for visual disturbance.  Respiratory: Negative for cough, chest tightness, shortness of breath and wheezing.   Cardiovascular: Negative for chest pain.  Gastrointestinal: Positive for nausea and abdominal pain. Negative for vomiting, diarrhea and constipation.  Endocrine: Negative for polydipsia, polyphagia and polyuria.  Genitourinary: Negative for dysuria, urgency, frequency and hematuria.  Musculoskeletal: Negative for back pain and neck stiffness.  Skin: Negative for rash.  Allergic/Immunologic: Negative for immunocompromised state.  Neurological: Negative for syncope, light-headedness and headaches.  Hematological: Does not bruise/bleed easily.  Psychiatric/Behavioral: Negative for sleep disturbance. The patient is not nervous/anxious.       Allergies  Review of patient's allergies indicates no known allergies.  Home Medications   Prior to Admission medications   Medication Sig Start Date End Date Taking? Authorizing Provider  ibuprofen (ADVIL,MOTRIN) 200 MG tablet Take 800 mg by mouth 2 (two) times daily as needed for mild pain or moderate pain.    Yes Historical Provider, MD  Probiotic Product (PROBIOTIC PO) Take 1 capsule by mouth daily.   Yes Historical Provider, MD  progesterone (PROMETRIUM) 100 MG capsule Take 100 mg by mouth daily.   Yes Historical Provider, MD  promethazine (PHENERGAN) 25 MG tablet Take 25 mg by mouth every 6 (six) hours as needed for nausea or vomiting.   Yes Historical Provider, MD  sulfamethoxazole-trimethoprim (BACTRIM DS,SEPTRA DS) 800-160 MG per tablet Take 1 tablet by mouth 2 (two) times daily. 10/24/14  Yes Marny Lowenstein, PA-C  Thyroid 97.5 MG TABS  Take 1 tablet by mouth daily.   Yes Historical Provider, MD  valACYclovir (VALTREX) 1000 MG tablet Take 1 tablet (1,000 mg total) by mouth 2 (two) times daily. Take for ten days. 10/24/14  Yes Marny Lowenstein, PA-C  ondansetron (ZOFRAN ODT) 8 MG disintegrating tablet  ODT q4 hours prn nausea 10/26/14   Dahlia Client Telisa Ohlsen, PA-C  oxyCODONE-acetaminophen (PERCOCET/ROXICET) 5-325 MG per tablet Take 1-2 tablets by mouth every 4 (four) hours as needed for severe pain. 10/26/14   Jacksen Isip, PA-C   BP 112/66 mmHg  Pulse 87  Temp(Src) 98.3 F (36.8 C) (Oral)  Resp 14  Ht  (1.549 m)  Wt 198 lb (89.812 kg)  BMI 37.43 kg/m2  SpO2 96%  LMP 10/20/2014 (Exact Date) Physical Exam  Constitutional: She appears well-developed and well-nourished.  HENT:  Head: Normocephalic and atraumatic.  Mouth/Throat: Oropharynx is clear and moist.  Eyes: Conjunctivae are normal. No scleral icterus.  Cardiovascular: Normal rate, regular rhythm, normal heart sounds and intact distal pulses.   No murmur heard. Pulmonary/Chest: Effort normal and breath sounds normal.  Abdominal: Soft. Bowel sounds are normal. She exhibits no distension and no mass. There is tenderness in the right upper quadrant. There is guarding and positive Murphy's sign. There is no rebound and no CVA tenderness.  Right upper quadrant abdominal tenderness with positive murphy sign.    Neurological: She is alert.  Skin: Skin is warm and dry.  Psychiatric: She has a normal mood and affect.  Nursing note and vitals reviewed.   ED Course  Procedures (including critical care time) Labs Review Labs Reviewed  COMPREHENSIVE METABOLIC PANEL - Abnormal; Notable for the following:    Glucose, Bld 200 (*)    Creatinine, Ser 1.12 (*)    AST 41 (*)    ALT 40 (*)    GFR calc non Af Amer 61 (*)    GFR calc Af Amer 70 (*)    All other components within normal limits  CBC WITH DIFFERENTIAL/PLATELET  LIPASE, BLOOD    Imaging Review US  Abdomen Limited Ruq  10/25/2014   CLINICAL DATA:  Right upper quadrant pain and nausea for 5 days  EXAM: US ABDOMEN LIMITED - RIGHT UPPER QUADRANT  COMPARISON:  None.  FINDINGS: Gallbladder:  A 2 cm gallstone is noted within the gallbladder. No wall thickening or pericholecystic fluid is noted. A negative sonographic Eulah Pont sign is noted.  Common bile duct:  Diameter: 3.2 mm.  Liver:  Diffusely increased in echogenicity consistent with fatty infiltration.  IMPRESSION: Fatty liver.  Cholelithiasis without complicating factors.   Electronically Signed   By: Alcide Clever M.D.   On: 10/25/2014 22:26     EKG Interpretation None      MDM   Final diagnoses:  RUQ abdominal pain  Calculus of gallbladder without cholecystitis without obstruction   Hilbert Odor presents with epigastric abdominal pain and nausea for several days. Patient without vomiting but reports decreased by mouth intake because of her nausea. Will obtain labs, imaging and reassess. Concern for possible cholelithiasis  versus pancreatitis versus gastritis.  11:45PM Ultrasound of abdomen with cholelithiasis without choledocholithiasis or cholecystitis. Labs reassuring. Slightly elevated AST and ALT. Patient pain is controlled. We'll by mouth trial.  12:30PM Patient able to tolerate by mouth fluids greater than 6 ounces without emesis. Her pain remains under control. Will be discharged home with pain control, nausea medicine and outpatient surgery follow-up.  I have personally reviewed patient's vitals, nursing note and any pertinent labs or imaging.  I performed an undressed physical exam.    It has been determined that no acute conditions requiring further emergency intervention are present at this time. The patient/guardian have been advised of the diagnosis and plan. I reviewed all labs and imaging including any potential incidental findings. We have discussed signs and symptoms that warrant return to the ED and they are listed  in the discharge instructions.    Vital signs are stable at discharge.   BP 112/66 mmHg  Pulse 87  Temp(Src) 98.3 F (36.8 C) (Oral)  Resp 14  Ht 5\' 1"  (1.549 m)  Wt 198 lb (89.812 kg)  BMI 37.43 kg/m2  SpO2 96%  LMP 10/20/2014 (Exact Date)  The patient was discussed with and seen by Dr. Jeraldine LootsLockwood who agrees with the treatment plan.   Dahlia ClientHannah Tremayne Sheldon, PA-C 10/26/14 0101  Gerhard Munchobert Lockwood, MD 10/27/14 [phone]

## 2014-10-25 NOTE — MAU Provider Note (Signed)
History     CSN: [id number]  Arrival date and time: 10/25/14 1527   First Provider Initiated Contact with Patient 10/25/14 1856      Chief Complaint  Patient presents with  . Nausea   HPI Comments: [name] 39 y.o. [id number] presents to MAU with RUQ pain ongoing since Saturday. She is nauseated but not vomiting. She was seen here and Urgent Care this week and diagnosed with HSV and placed on Valtrex. She was also dx with UTI and given Septra which she has taken 4 doses. She feels this not the problem today. Her appendix has been removed.      Past Medical History  Diagnosis Date  . Thyroid disease   . Diabetes mellitus without complication   . Arthritis   . Infection     UTI  . Congenital hip dysplasia   . Genital herpes   . Endometriosis     Past Surgical History  Procedure Laterality Date  . Cesarean section    . Laparoscopic appendectomy  06/19/2012    Procedure: APPENDECTOMY LAPAROSCOPIC;  Surgeon: Lynn Pu. Cornett, MD;  Location: [facility] OR;  Service: General;  Laterality: N/A;  . Appendectomy    . Joint replacement      rt and left hip    Family History  Problem Relation Age of Onset  . Hypertension Mother   . Hypertension Brother   . Heart disease Maternal Grandmother     History  Substance Use Topics  . Smoking status: Never Smoker   . Smokeless tobacco: Never Used  . Alcohol Use: No    Allergies: No Known Allergies  Prescriptions prior to admission  Medication Sig Dispense Refill Last Dose  . ibuprofen (ADVIL,MOTRIN) 200 MG tablet Take 800 mg by mouth 2 (two) times daily as needed. For pain   Taking  . levothyroxine (SYNTHROID, LEVOTHROID) 50 MCG tablet Take 50 mcg by mouth daily before breakfast.   Taking  . predniSONE (DELTASONE) 10 MG tablet 5 tabs PO for 2 days, 4 tabs for 2 days, 3 tabs for 2 days, 1 tab for 2 days, 26 tablet 0   . sulfamethoxazole-trimethoprim (BACTRIM DS,SEPTRA DS) 800-160 MG per tablet Take 1 tablet by mouth 2 (two)  times daily. 14 tablet 0   . tiZANidine (ZANAFLEX) 4 MG tablet Take 1 tablet (4 mg total) by mouth every 8 (eight) hours as needed for muscle spasms. 30 tablet 0   . traMADol (ULTRAM) 50 MG tablet Take 1 tablet (50 mg total) by mouth every 12 (twelve) hours as needed for severe pain. 30 tablet 0   . valACYclovir (VALTREX) 1000 MG tablet Take 1 tablet (1,000 mg total) by mouth 2 (two) times daily. Take for ten days. 30 tablet 6     Review of Systems  Constitutional: Negative.   HENT: Negative.   Respiratory: Negative.   Cardiovascular: Negative.   Gastrointestinal: Positive for nausea and abdominal pain. Negative for vomiting.  Genitourinary: Negative.   Musculoskeletal: Negative.   Skin: Negative.   Neurological: Negative.   Psychiatric/Behavioral: Negative.    Physical Exam   Blood pressure 142/86, pulse 93, temperature 98.3 F (36.8 C), temperature source Oral, resp. rate 18, height  (1.549 m), weight 89.812 kg (198 lb), last menstrual period 10/20/2014.  Physical Exam  Constitutional: She is oriented to person, place, and time. She appears well-developed and well-nourished.  Appears uncomfortable  HENT:  Head: Normocephalic and atraumatic.  Eyes: Pupils are equal, round, and reactive to light.  Cardiovascular: Normal rate, regular rhythm and normal heart sounds.   Respiratory: Effort normal and breath sounds normal. No respiratory distress. She has no wheezes. She has no rales.  GI: Bowel sounds are normal. She exhibits distension. She exhibits no mass. There is tenderness. There is no rebound and no guarding.  Musculoskeletal: Normal range of motion.  Neurological: She is alert and oriented to person, place, and time.  Skin: Skin is warm.  Psychiatric: She has a normal mood and affect. Her behavior is normal. Judgment and thought content normal.   No results found for this or any previous visit (from the past 24 hour(s)).  MAU Course  Procedures  MDM  Zofran 8 mg  ODT Spoke with Dr Adalberto IllGrewall who advised [facility] Pt and significant other feel confident to [facility]  Assessment and Plan   A: RUQ abdominal pain with nausea  P: Zofran here [facility] Follow up with Dr Arelia SneddonMcComb as needed  Carolynn ServeBarefoot, [name] 10/25/2014, 7:11 PM

## 2014-10-25 NOTE — MAU Note (Signed)
RUQ tender to touch, hurts without touch after eating ,very nauseated. Not keeping food down.

## 2014-10-25 NOTE — ED Notes (Addendum)
Patient reports RUQ pain x 5 days.  Pain worse after eating and with palpation.  Reports nausea after eating, denies vomiting.  Seen at urgent care for same on Monday.  [facility] for UTI and herpes outbreak.

## 2014-10-25 NOTE — MAU Note (Addendum)
Pt reports she has been having abd and nausea since Sat. Sen in urgent care and here this week. Treated for Herpes. Still c/o pain. Been on Valtrex for several days. Pt stated she has not wanted to eat . Every time she eats she gets nauseated.

## 2014-10-25 NOTE — Discharge Instructions (Signed)
Abdominal Pain, Women °Abdominal (stomach, pelvic, or belly) pain can be caused by many things. It is important to tell your doctor: °· The location of the pain. °· Does it come and go or is it present all the time? °· Are there things that start the pain (eating certain foods, exercise)? °· Are there other symptoms associated with the pain (fever, nausea, vomiting, diarrhea)? °All of this is helpful to know when trying to find the cause of the pain. °CAUSES  °· Stomach: virus or bacteria infection, or ulcer. °· Intestine: appendicitis (inflamed appendix), regional ileitis (Crohn's disease), ulcerative colitis (inflamed colon), irritable bowel syndrome, diverticulitis (inflamed diverticulum of the colon), or cancer of the stomach or intestine. °· Gallbladder disease or stones in the gallbladder. °· Kidney disease, kidney stones, or infection. °· Pancreas infection or cancer. °· Fibromyalgia (pain disorder). °· Diseases of the female organs: °¨ Uterus: fibroid (non-cancerous) tumors or infection. °¨ Fallopian tubes: infection or tubal pregnancy. °¨ Ovary: cysts or tumors. °¨ Pelvic adhesions (scar tissue). °¨ Endometriosis (uterus lining tissue growing in the pelvis and on the pelvic organs). °¨ Pelvic congestion syndrome (female organs filling up with blood just before the menstrual period). °¨ Pain with the menstrual period. °¨ Pain with ovulation (producing an egg). °¨ Pain with an IUD (intrauterine device, birth control) in the uterus. °¨ Cancer of the female organs. °· Functional pain (pain not caused by a disease, may improve without treatment). °· Psychological pain. °· Depression. °DIAGNOSIS  °Your doctor will decide the seriousness of your pain by doing an examination. °· Blood tests. °· X-rays. °· Ultrasound. °· CT scan (computed tomography, special type of X-ray). °· MRI (magnetic resonance imaging). °· Cultures, for infection. °· Barium enema (dye inserted in the large intestine, to better view it with  X-rays). °· Colonoscopy (looking in intestine with a lighted tube). °· Laparoscopy (minor surgery, looking in abdomen with a lighted tube). °· Major abdominal exploratory surgery (looking in abdomen with a large incision). °TREATMENT  °The treatment will depend on the cause of the pain.  °· Many cases can be observed and treated at home. °· Over-the-counter medicines recommended by your caregiver. °· Prescription medicine. °· Antibiotics, for infection. °· Birth control pills, for painful periods or for ovulation pain. °· Hormone treatment, for endometriosis. °· Nerve blocking injections. °· Physical therapy. °· Antidepressants. °· Counseling with a psychologist or psychiatrist. °· Minor or major surgery. °HOME CARE INSTRUCTIONS  °· Do not take laxatives, unless directed by your caregiver. °· Take over-the-counter pain medicine only if ordered by your caregiver. Do not take aspirin because it can cause an upset stomach or bleeding. °· Try a clear liquid diet (broth or water) as ordered by your caregiver. Slowly move to a bland diet, as tolerated, if the pain is related to the stomach or intestine. °· Have a thermometer and take your temperature several times a day, and record it. °· Bed rest and sleep, if it helps the pain. °· Avoid sexual intercourse, if it causes pain. °· Avoid stressful situations. °· Keep your follow-up appointments and tests, as your caregiver orders. °· If the pain does not go away with medicine or surgery, you may try: °¨ Acupuncture. °¨ Relaxation exercises (yoga, meditation). °¨ Group therapy. °¨ Counseling. °SEEK MEDICAL CARE IF:  °· You notice certain foods cause stomach pain. °· Your home care treatment is not helping your pain. °· You need stronger pain medicine. °· You want your IUD removed. °· You feel faint or   lightheaded. °· You develop nausea and vomiting. °· You develop a rash. °· You are having side effects or an allergy to your medicine. °SEEK IMMEDIATE MEDICAL CARE IF:  °· Your  pain does not go away or gets worse. °· You have a fever. °· Your pain is felt only in portions of the abdomen. The right side could possibly be appendicitis. The left lower portion of the abdomen could be colitis or diverticulitis. °· You are passing blood in your stools (bright red or black tarry stools, with or without vomiting). °· You have blood in your urine. °· You develop chills, with or without a fever. °· You pass out. °MAKE SURE YOU:  °· Understand these instructions. °· Will watch your condition. °· Will get help right away if you are not doing well or get worse. °Document Released: 06/07/2007 Document Revised: 12/25/2013 Document Reviewed: 06/27/2009 °ExitCare® Patient Information ©2015 [employer]. This information is not intended to replace advice given to you by your health care provider. Make sure you discuss any questions you have with your health care provider. ° °

## 2014-10-26 ENCOUNTER — Telehealth: Payer: Self-pay | Admitting: Medical

## 2014-10-26 MED ORDER — OXYCODONE-ACETAMINOPHEN 5-325 MG PO TABS
2.0000 | ORAL_TABLET | Freq: Once | ORAL | Status: AC
  Administered 2014-10-26: 2 via ORAL
  Filled 2014-10-26: qty 2

## 2014-10-26 MED ORDER — OXYCODONE-ACETAMINOPHEN 5-325 MG PO TABS: 1.0000 | ORAL_TABLET | ORAL | Status: DC | PRN

## 2014-10-26 MED ORDER — ONDANSETRON 8 MG PO TBDP: ORAL_TABLET | ORAL | Status: DC

## 2014-10-26 NOTE — Telephone Encounter (Signed)
LM for patient to return call to MAU. +HSV culture. Already has Rx for Valtrex just need to be informed.   Marny LowensteinJulie N Wenzel, PA-C  10/26/2014 3:46 PM

## 2014-10-26 NOTE — Telephone Encounter (Signed)
Patient returned call to MAU. Advised of +HSV 2 culture. Patient states she was told already. Advised to continue Valtrex and follow-up in the office. Patient voiced understanding.   Marny LowensteinJulie N Beyonce Sawatzky, PA-C 10/26/2014 4:16 PM

## 2014-10-26 NOTE — Discharge Instructions (Signed)
1. Medications: zofran, percocet, usual home medications °2. Treatment: rest, drink plenty of fluids, advance diet slowly °3. Follow Up: Please followup with your primary doctor in 2 days for discussion of your diagnoses and further evaluation after today's visit; if you do not have a primary care doctor use the resource guide provided to find one; Please return to the ER for persistent vomiting, high fevers or worsening symptoms ° °Cholelithiasis °Cholelithiasis (also called gallstones) is a form of gallbladder disease in which gallstones form in your gallbladder. The gallbladder is an organ that stores bile made in the liver, which helps digest fats. Gallstones begin as small crystals and slowly grow into stones. Gallstone pain occurs when the gallbladder spasms and a gallstone is blocking the duct. Pain can also occur when a stone passes out of the duct.  °RISK FACTORS °· Being female.   °· Having multiple pregnancies. Health care providers sometimes advise removing diseased gallbladders before future pregnancies.   °· Being obese. °· Eating a diet heavy in fried foods and fat.   °· Being older than 60 years and increasing age.   °· Prolonged use of medicines containing female hormones.   °· Having diabetes mellitus.   °· Rapidly losing weight.   °· Having a family history of gallstones (heredity).   °SYMPTOMS °· Nausea.   °· Vomiting. °· Abdominal pain.   °· Yellowing of the skin (jaundice).   °· Sudden pain. It may persist from several minutes to several hours. °· Fever.   °· Tenderness to the touch.  °In some cases, when gallstones do not move into the bile duct, people have no pain or symptoms. These are called "silent" gallstones.  °TREATMENT °Silent gallstones do not need treatment. In severe cases, emergency surgery may be required. Options for treatment include: °· Surgery to remove the gallbladder. This is the most common treatment. °· Medicines. These do not always work and may take 6-12 months or more  to work. °· Shock wave treatment (extracorporeal biliary lithotripsy). In this treatment an ultrasound machine sends shock waves to the gallbladder to break gallstones into smaller pieces that can pass into the intestines or be dissolved by medicine. °HOME CARE INSTRUCTIONS  °· Only take over-the-counter or prescription medicines for pain, discomfort, or fever as directed by your health care provider.   °· Follow a low-fat diet until seen again by your health care provider. Fat causes the gallbladder to contract, which can result in pain.   °· Follow up with your health care provider as directed. Attacks are almost always recurrent and surgery is usually required for permanent treatment.   °SEEK IMMEDIATE MEDICAL CARE IF:  °· Your pain increases and is not controlled by medicines.   °· You have a fever or persistent symptoms for more than 2-3 days.   °· You have a fever and your symptoms suddenly get worse.   °· You have persistent nausea and vomiting.   °MAKE SURE YOU:  °· Understand these instructions. °· Will watch your condition. °· Will get help right away if you are not doing well or get worse. °Document Released: 08/06/2005 Document Revised: 04/12/2013 Document Reviewed: 02/01/2013 °ExitCare® Patient Information ©2015 [employer]. This information is not intended to replace advice given to you by your health care provider. Make sure you discuss any questions you have with your health care provider. ° °

## 2014-10-27 ENCOUNTER — Encounter: Payer: Self-pay | Admitting: Advanced Practice Midwife

## 2014-10-27 DIAGNOSIS — A6000 Herpesviral infection of urogenital system, unspecified: Secondary | ICD-10-CM | POA: Insufficient documentation

## 2014-11-20 ENCOUNTER — Ambulatory Visit (INDEPENDENT_AMBULATORY_CARE_PROVIDER_SITE_OTHER): Payer: BC Managed Care – PPO | Admitting: Physician Assistant

## 2014-11-20 VITALS — BP 128/82 | HR 87 | Temp 98.5°F | Resp 17 | Ht 63.0 in | Wt 197.0 lb

## 2014-11-20 DIAGNOSIS — Z96649 Presence of unspecified artificial hip joint: Secondary | ICD-10-CM | POA: Insufficient documentation

## 2014-11-20 DIAGNOSIS — R350 Frequency of micturition: Secondary | ICD-10-CM | POA: Diagnosis not present

## 2014-11-20 DIAGNOSIS — Z9229 Personal history of other drug therapy: Secondary | ICD-10-CM

## 2014-11-20 DIAGNOSIS — Z96643 Presence of artificial hip joint, bilateral: Secondary | ICD-10-CM

## 2014-11-20 DIAGNOSIS — Z789 Other specified health status: Secondary | ICD-10-CM

## 2014-11-20 DIAGNOSIS — Z0000 Encounter for general adult medical examination without abnormal findings: Secondary | ICD-10-CM

## 2014-11-20 DIAGNOSIS — K8020 Calculus of gallbladder without cholecystitis without obstruction: Secondary | ICD-10-CM | POA: Diagnosis not present

## 2014-11-20 DIAGNOSIS — E119 Type 2 diabetes mellitus without complications: Secondary | ICD-10-CM

## 2014-11-20 DIAGNOSIS — Z23 Encounter for immunization: Secondary | ICD-10-CM | POA: Diagnosis not present

## 2014-11-20 LAB — POCT URINALYSIS DIPSTICK
Bilirubin, UA: NEGATIVE
Glucose, UA: NEGATIVE
Leukocytes, UA: NEGATIVE
NITRITE UA: NEGATIVE
PH UA: 5
PROTEIN UA: NEGATIVE
SPEC GRAV UA: 1.025
UROBILINOGEN UA: 0.2

## 2014-11-20 LAB — POCT UA - MICROSCOPIC ONLY
Bacteria, U Microscopic: NEGATIVE
CRYSTALS, UR, HPF, POC: NEGATIVE
Casts, Ur, LPF, POC: NEGATIVE
Mucus, UA: NEGATIVE
WBC, Ur, HPF, POC: NEGATIVE
YEAST UA: NEGATIVE

## 2014-11-20 MED ORDER — AMOXICILLIN 500 MG PO CAPS: ORAL_CAPSULE | ORAL | Status: AC

## 2014-11-20 NOTE — Patient Instructions (Addendum)
I will call you with the results of your urine culture and will prescribe antibiotics if need be. Drink plenty of fluids and buy over the counter AZO to help with pressure in your abdomen. I will call you with the results of your lab tests. Get your endocrinologist to draw labs and set you up for a mammogram in May. The evidence states that antibiotics before dental procedures is NOT recommended for patients with prosthetic joints. Talk with your dentist and see if they recommend anything different. Return with any problems/concerns.

## 2014-11-20 NOTE — Progress Notes (Signed)
Subjective:    Patient ID: [name], female    DOB: 03-03-1976, 39 y.o.   MRN: [id number]  HPI  This is a 39 year old female with PMH DM and hypothyroid who is presenting for CPE and needing form filled out for work.  Was dx'd with DM 4 months ago. Sees an endocrinologist in [facility] for hypothyroid and DM. Next appt in 2 months.   Reports she has had 3 UTIs in the past 7 months. Prior to 7 months ago she rarely had UTIs. Last treated 1 month ago with bactrim. Symptom went away for 2 weeks but then came back. Having urinary frequency and lower abdominal "fullness". Has sensation of incomplete voidance. Denies fever, chills, N/V or back pain. LMP 11/16/14. She is still on period now. Sexually active. No concern for STDs. She states every time she is [facility] with [facility] They always put her on bactrim. She feel bactrim does not work for her. She is unsure if they have done a urine culture.  Pt is wanting amoxicillin for an upcoming dental procedure. She has had bilateral hip replacements d/t congenital hip dysplasia. She states her dentist will not do any procedures without her having prophylactic abx.  Pt states 1 month ago she was dx'd with gallstones. She was having abdominal pain at that time. Her symptoms have improved since changing her diet.  Contraception: Does not use anything. Still has uterus and ovaries.  Last pap: over a year ago. Never had an abnormal. Does this with her GYN at 20 for women. Immunizations: last tdap over 10 years ago. Mammogram: due - states she wants to wait to see her endocrinologist who will schedule this for her. Pt also deferred blood work today - wanting to get this done with her endo as well.  Review of Systems  Constitutional: Negative for fever and chills.  HENT: Negative.   Eyes: Negative for visual disturbance.  Respiratory: Negative.   Cardiovascular: Negative.   Gastrointestinal: Positive for abdominal  pain. Negative for nausea and vomiting.  Genitourinary: Positive for frequency. Negative for dysuria, hematuria, vaginal discharge and menstrual problem.  Musculoskeletal: Negative for back pain.  Skin: Negative for rash.  Hematological: Negative for adenopathy.    Patient Active Problem List   Diagnosis Date Noted  . Diabetes mellitus 11/20/2014  . S/P hip replacement 11/20/2014  . Gallstones 11/20/2014  . Genital herpes simplex type 2   . Unspecified hypothyroidism 04/25/2014  . PCOS (polycystic ovarian syndrome) 04/25/2014    Prior to Admission medications   Medication Sig Start Date End Date Taking? Authorizing Provider  ibuprofen (ADVIL,MOTRIN) 200 MG tablet Take 800 mg by mouth 2 (two) times daily as needed for mild pain or moderate pain.    Yes Historical Provider, MD  Probiotic Product (PROBIOTIC PO) Take 1 capsule by mouth daily.   Yes Historical Provider, MD  progesterone (PROMETRIUM) 100 MG capsule Take 100 mg by mouth daily.   Yes Historical Provider, MD  Thyroid 97.5 MG TABS Take 1 tablet by mouth daily.   Yes Historical Provider, MD  valACYclovir (VALTREX) 1000 MG tablet Take 1 tablet (1,000 mg total) by mouth 2 (two) times daily. Take for ten days. Patient not taking: Reported on 11/20/2014 10/24/14   Luvenia Redden, PA-C   No Known Allergies  Patient's social and family history were reviewed.     Objective:   Physical Exam  Constitutional: She is oriented to person,  place, and time.  HENT:  Head: Normocephalic and atraumatic.  Right Ear: Hearing, tympanic membrane, external ear and ear canal normal.  Left Ear: Hearing, tympanic membrane, external ear and ear canal normal.  Nose: Nose normal.  Mouth/Throat: Uvula is midline, oropharynx is clear and moist and mucous membranes are normal.  Eyes: Conjunctivae, EOM and lids are normal. Pupils are equal, round, and reactive to light. Right eye exhibits no discharge. Left eye exhibits no discharge. No scleral icterus.    Neck: Trachea normal. No thyromegaly present.  Cardiovascular: Normal rate, regular rhythm, normal heart sounds, intact distal pulses and normal pulses.   No murmur heard. Pulmonary/Chest: Effort normal and breath sounds normal. She has no wheezes. She has no rhonchi. She has no rales.  Abdominal: Soft. Normal appearance and bowel sounds are normal. She exhibits no abdominal bruit. There is no tenderness. There is no CVA tenderness.  Musculoskeletal: Normal range of motion.  Lymphadenopathy:       Head (right side): No submental, no submandibular, no tonsillar, no preauricular, no posterior auricular and no occipital adenopathy present.       Head (left side): No submental, no submandibular, no tonsillar, no preauricular, no posterior auricular and no occipital adenopathy present.    She has no cervical adenopathy.  Neurological: She is alert and oriented to person, place, and time. She has normal strength and normal reflexes. No cranial nerve deficit or sensory deficit. Coordination and gait normal.  Skin: Skin is warm, dry and intact. No lesion and no rash noted.  Psychiatric: She has a normal mood and affect. Her speech is normal and behavior is normal. Thought content normal.   BP 128/82 mmHg  Pulse 87  Temp(Src) 98.5 F (36.9 C) (Oral)  Resp 17  Ht 5' 3"  (1.6 m)  Wt 197 lb (89.359 kg)  BMI 34.91 kg/m2  SpO2 97%  LMP 10/20/2014 (Exact Date)  Results for orders placed or performed in visit on 11/20/14  POCT UA - Microscopic Only  Result Value Ref Range   WBC, Ur, HPF, POC neg    RBC, urine, microscopic tntc    Bacteria, U Microscopic neg    Mucus, UA neg    Epithelial cells, urine per micros 0-5    Crystals, Ur, HPF, POC neg    Casts, Ur, LPF, POC neg    Yeast, UA neg   POCT urinalysis dipstick  Result Value Ref Range   Color, UA yellow    Clarity, UA cloudy    Glucose, UA neg    Bilirubin, UA neg    Ketones, UA trace    Spec Grav, UA 1.025    Blood, UA large    pH,  UA 5.0    Protein, UA neg    Urobilinogen, UA 0.2    Nitrite, UA neg    Leukocytes, UA Negative       Assessment & Plan:  1. Urinary frequency UA negative except for blood which is likely d/t pt being on menstrual cycle. Urine clx pending. She await culture results before placing her on an abx. AZO in the meantime for symptoms. - POCT UA - Microscopic Only - POCT urinalysis dipstick - Urine culture  2. Need for Tdap vaccination - Tdap vaccine greater than or equal to 7yo IM  3. History of MMR vaccination 4. History of hep B vaccination Needs proof of immunity for work. - Measles/Mumps/Rubella Immunity - Hepatitis B surface antibody  5. Annual physical exam 6. DM type 2 Form  filled out for work. She will follow up with endocrinologist to schedule mammogram and do blood work.  7. History of bilateral hip replacements Prescribed amoxil even though evidence does not support the need for prophylaxis for prosthetic joints. Pt states her dentist will not perform procedure without abx. Advised she speak to her dentist about this. - amoxicillin (AMOXIL) 500 MG capsule; Take 4 tabs (2 gm) po 1 hour prior to dental procedure.  Dispense: 4 capsule; Refill: 0  8. gallstones Continue with diet control.  Benjaman Pott Drenda Freeze, MHS [facility]  11/20/2014

## 2014-11-20 NOTE — Progress Notes (Signed)
Tuberculosis Risk Questionnaire  1. Were you born outside the [place] in one of the following parts of the world:    [place], [place], [place], [place] or [place]?  No  2. Have you traveled outside the [place] and lived for more than one month in one of the following parts of the world:  [place], [place], [place], [place] or [place]?  No  3. Do you have a compromised immune system such as from any of the following conditions:  HIV/AIDS, organ or bone marrow transplantation, diabetes, immunosuppressive   medicines (e.g. Prednisone, Remicaide), leukemia, lymphoma, cancer of the   head or neck, gastrectomy or jejunal bypass, end-stage renal disease (on   dialysis), or silicosis?  Yes Diabetes    4. Have you ever done one of the following:    Used crack cocaine, injected illegal drugs, worked or resided in jail or prison,   worked or resided at a homeless shelter, or worked as a Research scientist (physical sciences)healthcare worker in   direct contact with patients?  No  5. Have you ever been exposed to anyone with infectious tuberculosis?  No

## 2014-11-21 LAB — MEASLES/MUMPS/RUBELLA IMMUNITY
RUBELLA: 2.61 {index} — AB (ref <0.90)
Rubeola IgG: 93.3 AU/mL — ABNORMAL HIGH (ref <25.00)

## 2014-11-21 LAB — HEPATITIS B SURFACE ANTIBODY, QUANTITATIVE: Hepatitis B-Post: 8.2 m[IU]/mL

## 2014-11-21 LAB — URINE CULTURE
Colony Count: NO GROWTH
Organism ID, Bacteria: NO GROWTH

## 2014-11-27 ENCOUNTER — Telehealth: Payer: Self-pay | Admitting: Physician Assistant

## 2014-11-27 DIAGNOSIS — Z23 Encounter for immunization: Secondary | ICD-10-CM

## 2014-11-27 NOTE — Telephone Encounter (Signed)
Called pt and left VM to call back to discuss lab results. Her urine culture did not grow out any bacteria. She is not immune to Hep B or mumps. She is immune to measles and rubella. I will put in an order for hep B and MMR booster for her to come in at her convenience to get. I do not need to speak with patient unless she requests to speak with me.

## 2014-11-28 NOTE — Telephone Encounter (Signed)
Please call pt regarding my previous documentation.

## 2014-11-28 NOTE — Telephone Encounter (Signed)
lmom to cb. 

## 2014-11-29 NOTE — Telephone Encounter (Signed)
Gave pt. results

## 2015-02-14 ENCOUNTER — Encounter: Payer: Self-pay | Admitting: Family Medicine

## 2015-02-18 ENCOUNTER — Other Ambulatory Visit: Payer: Self-pay

## 2015-12-29 IMAGING — CR DG HIP W/ PELVIS BILAT
4 series · 4 of 4 positions shown · non-contrast
Comparison: CT 06/19/2012

CLINICAL DATA: Right hip pain

EXAM:
BILATERAL HIP WITH PELVIS - 4+ VIEW

[AP (1 of 2)]
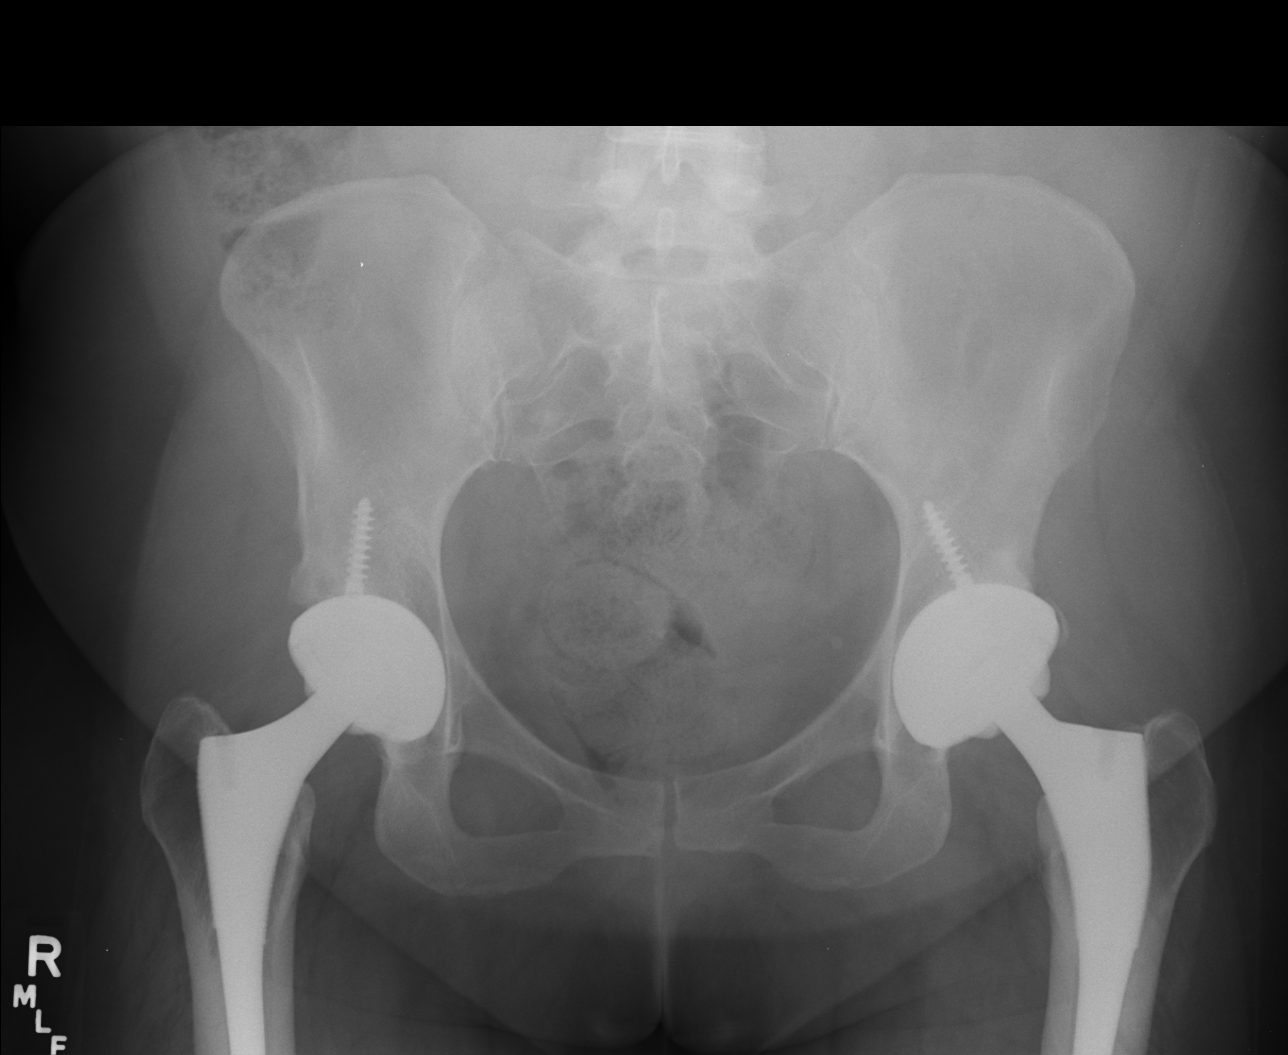

[lateral (1 of 2)]
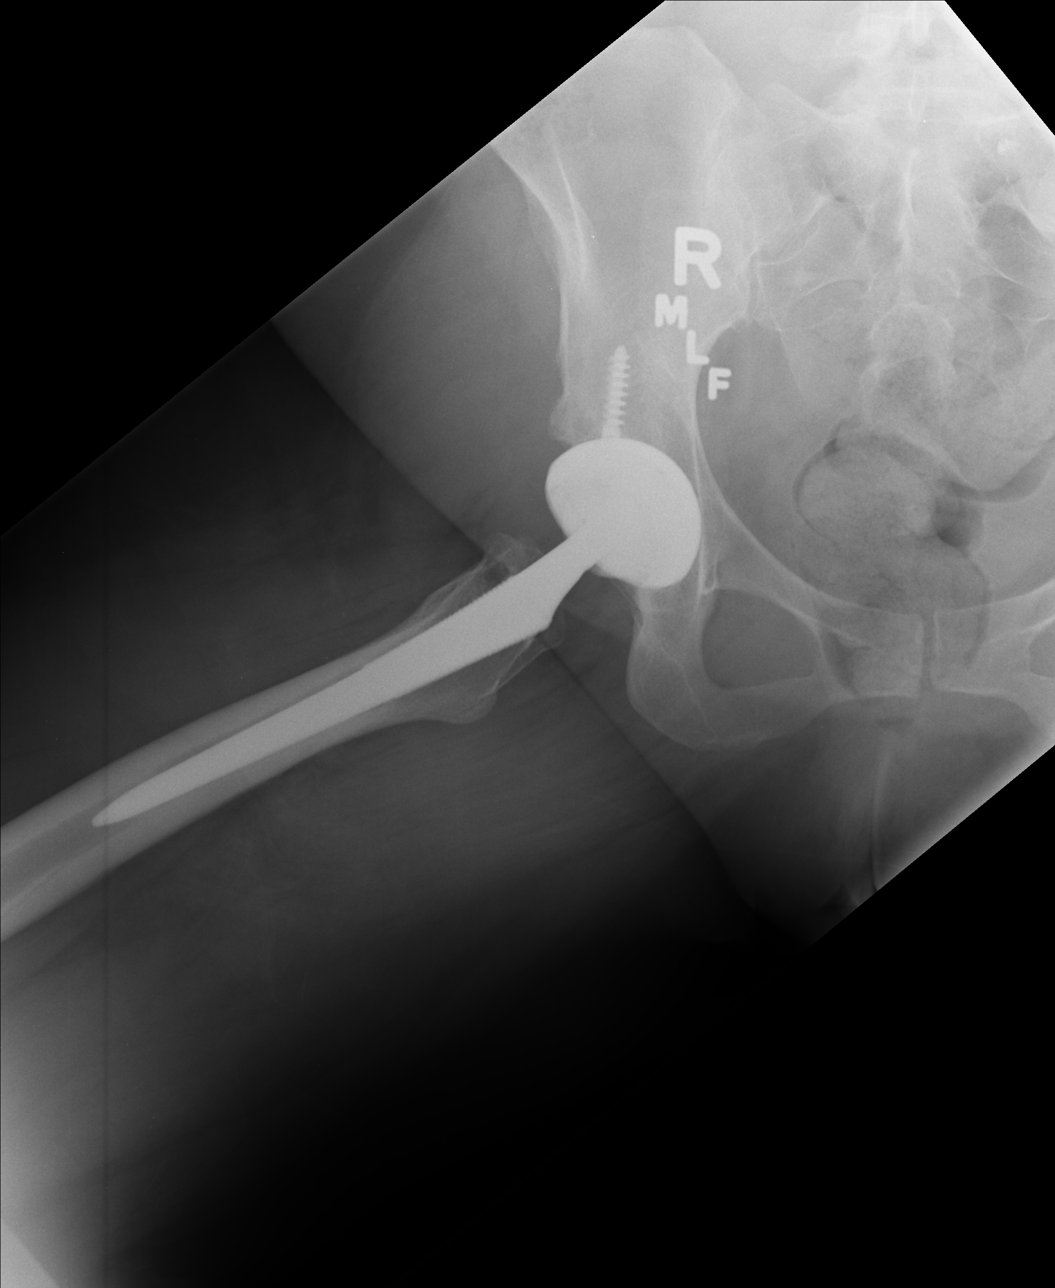

[lateral (2 of 2)]
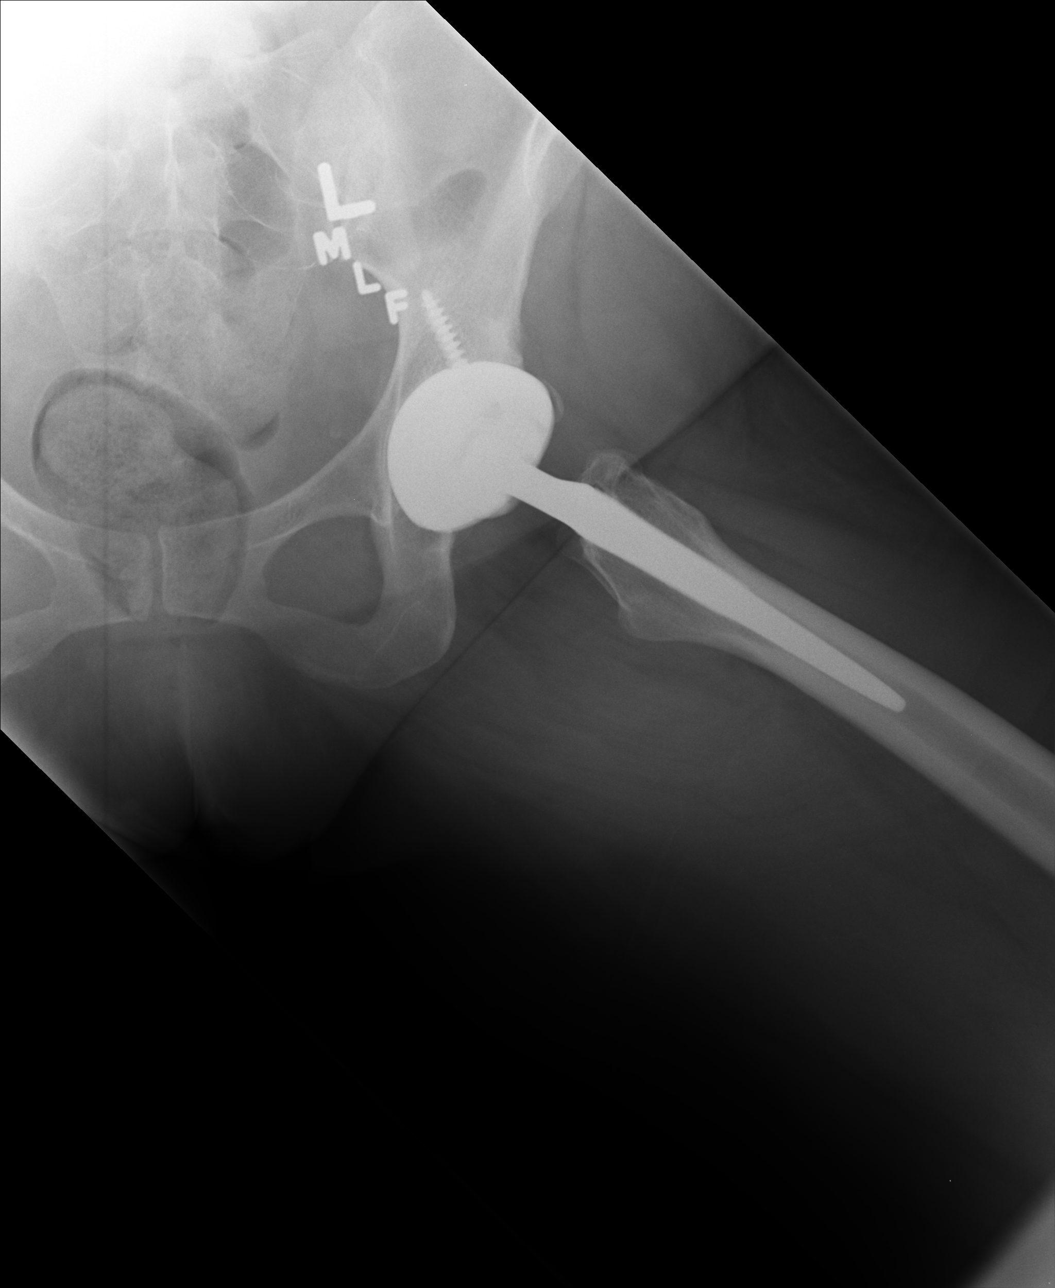

[AP (2 of 2)]
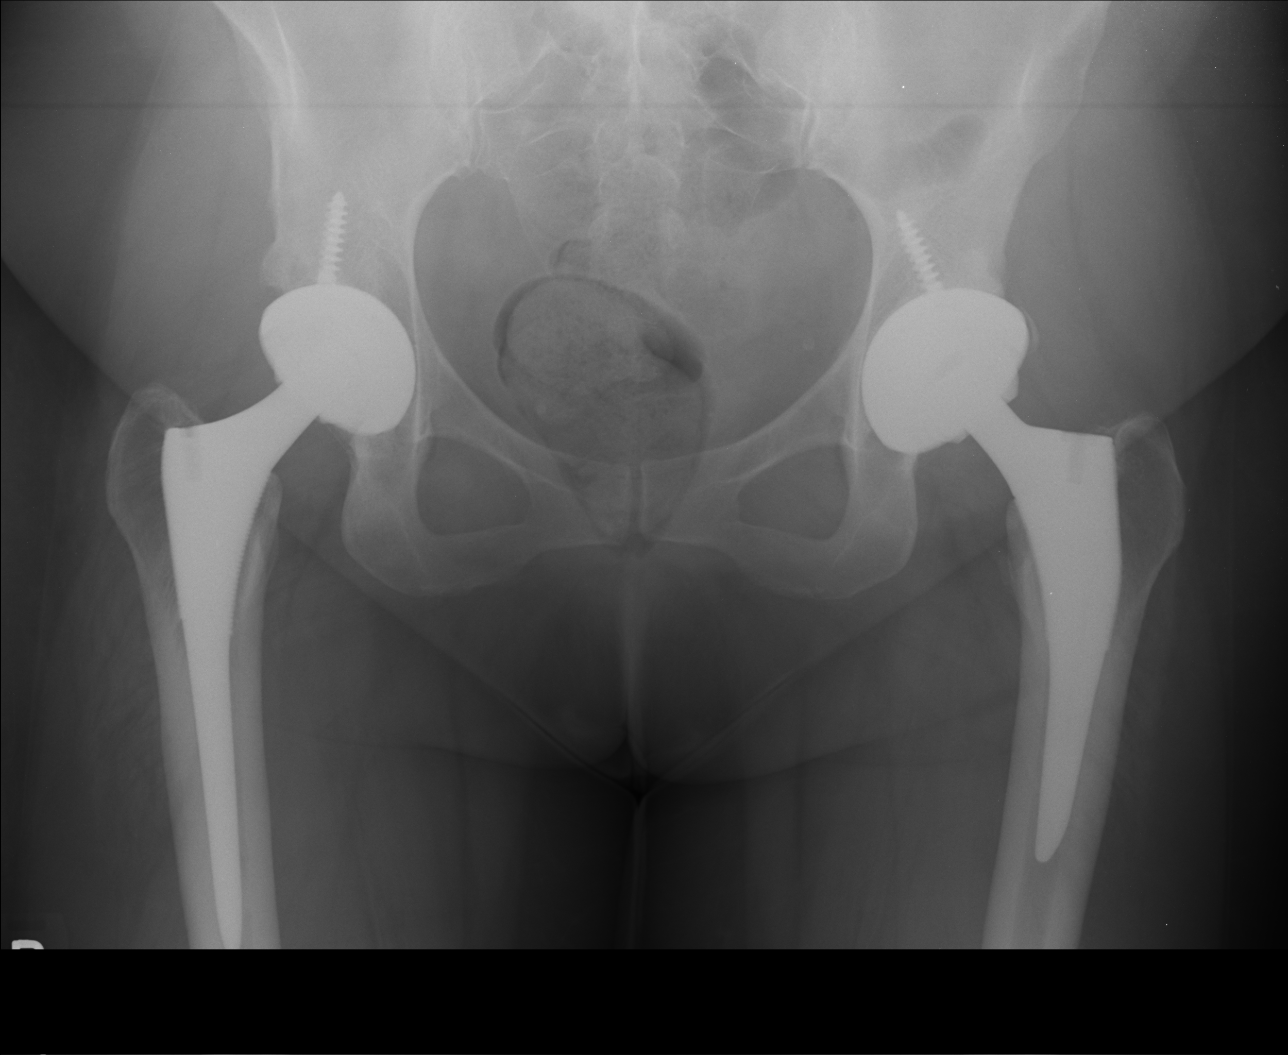

[4 of 4 positions shown; findings below may reference images not displayed]

FINDINGS: Bilateral hip arthroplasty hardware projects in expected location
without dislocation or fracture. Bony pelvis intact. Left pelvic
phlebolith.
IMPRESSION: 1. No acute abnormality.

## 2016-06-29 IMAGING — US US ABDOMEN LIMITED
1 series · 14 of 25 positions shown · non-contrast
Comparison: None.

CLINICAL DATA: Right upper quadrant pain and nausea for 5 days

EXAM:
US ABDOMEN LIMITED - RIGHT UPPER QUADRANT

[Series 1: us abdomen limited · 0.26mm/px · 14 of 35 slices shown]
[im 1/35]
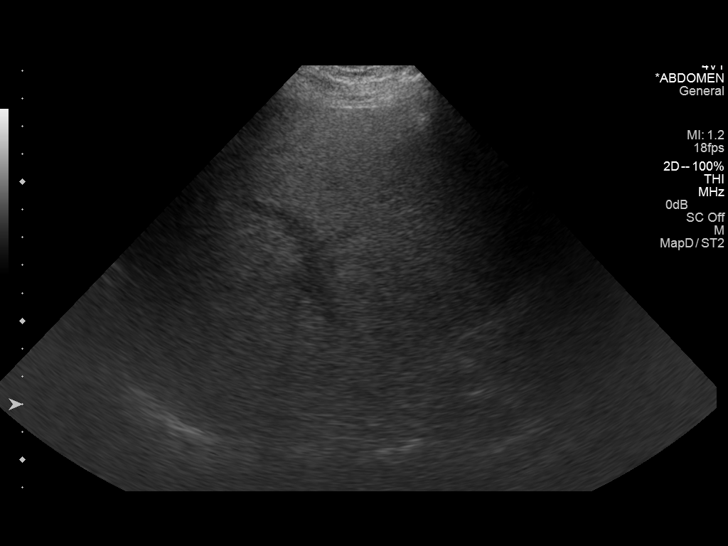
[im 3/35]
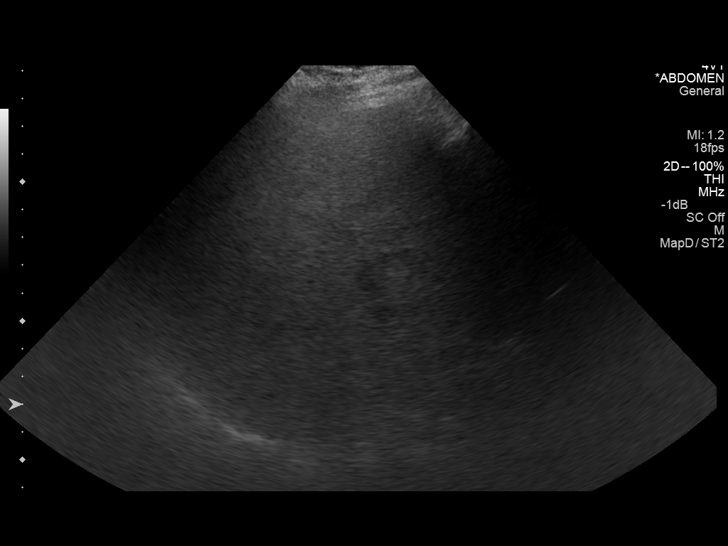
[im 6/35]
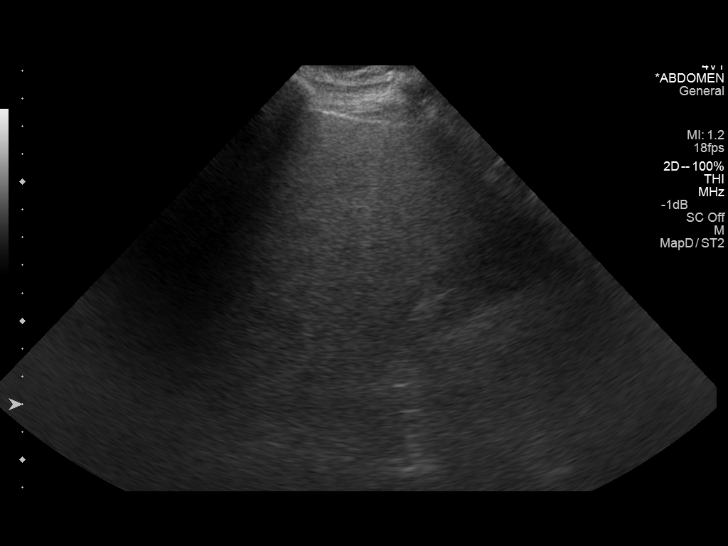
[im 9/35]
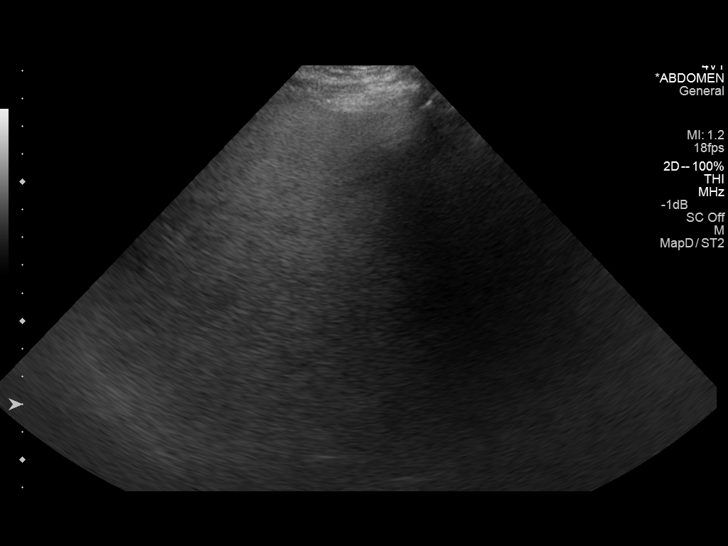
[im 12/35]
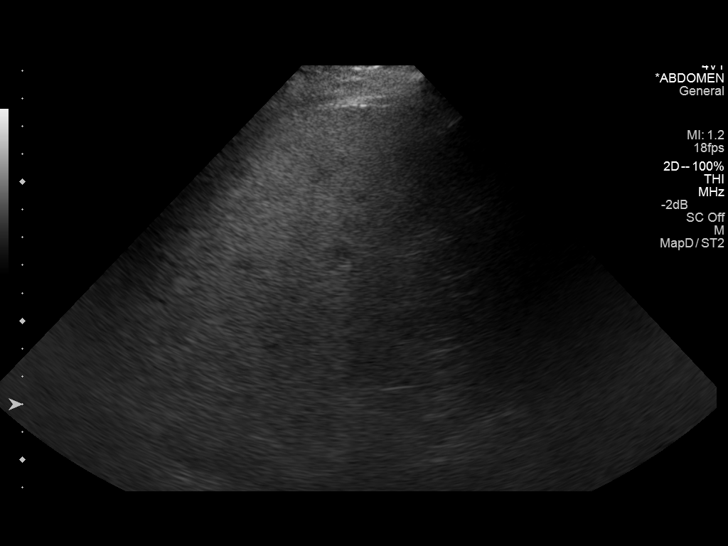
[im 13/35]
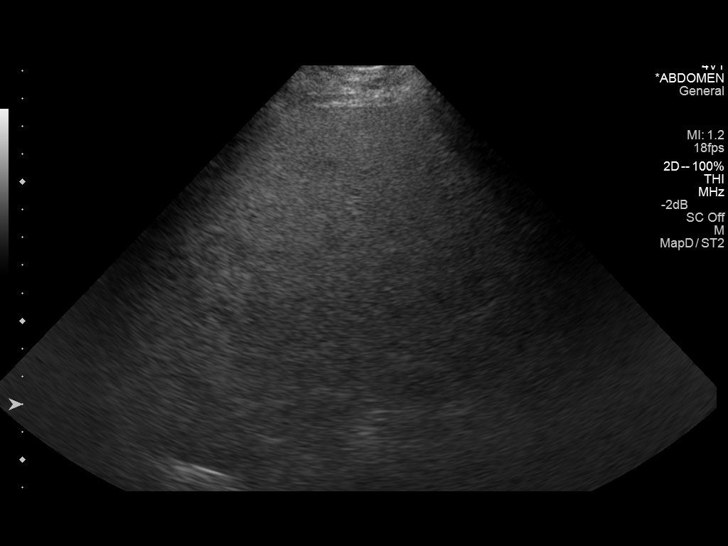
[im 16/35]
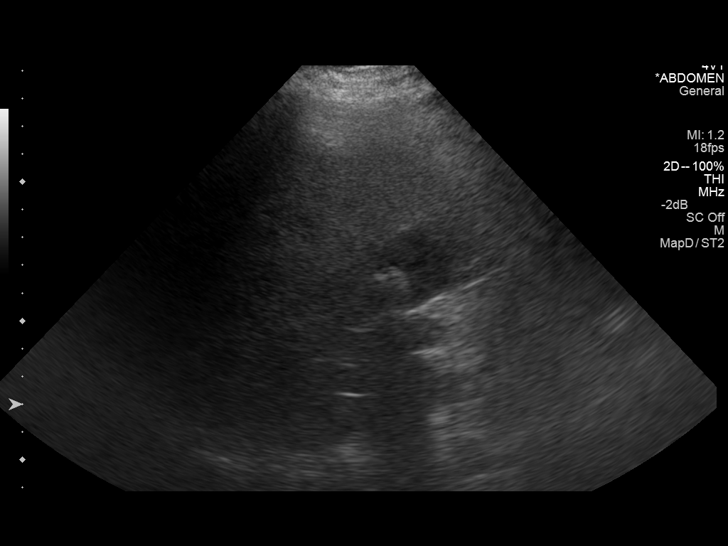
[im 19/35]
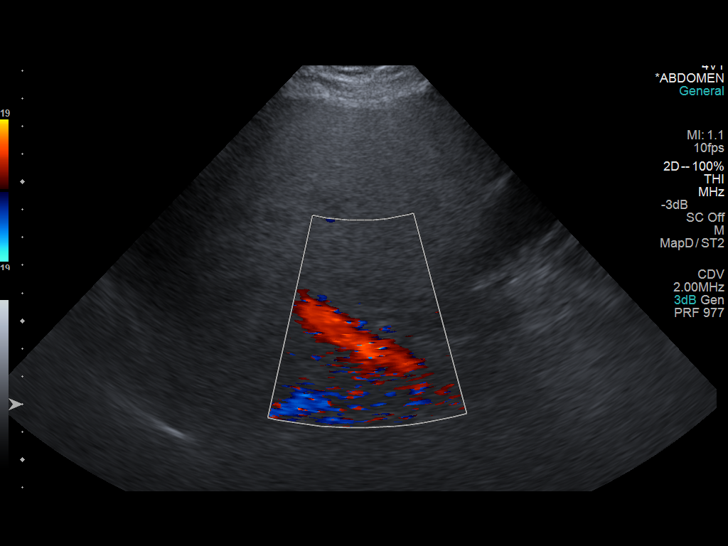
[im 22/35]
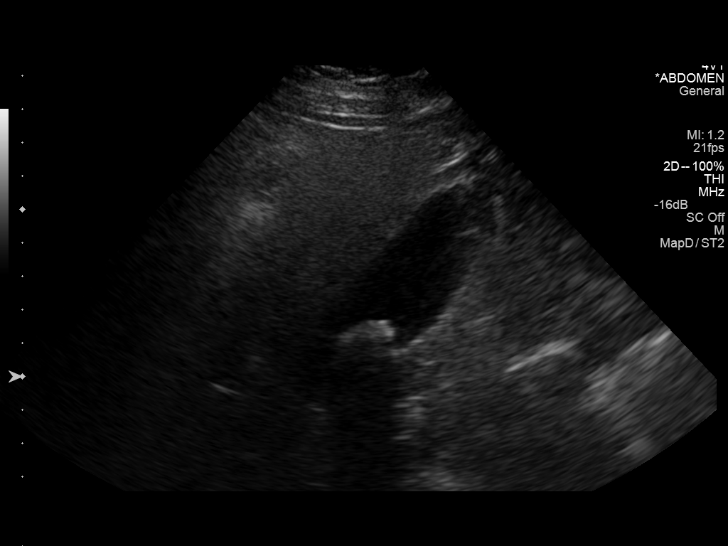
[im 23/35]
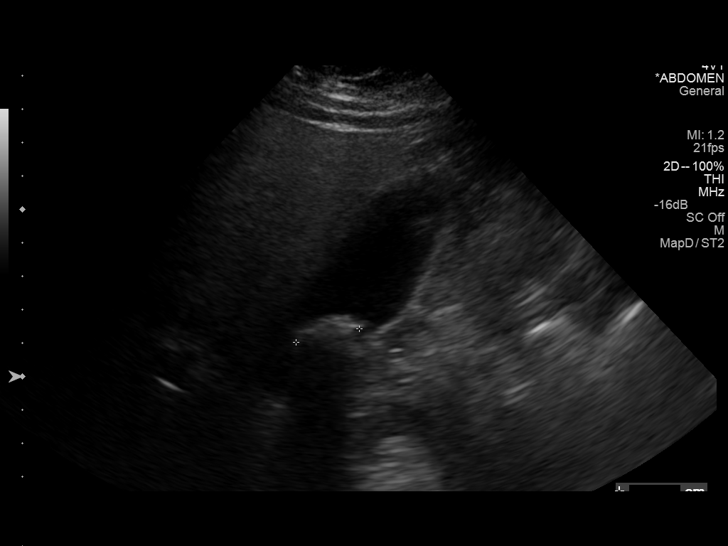
[im 26/35]
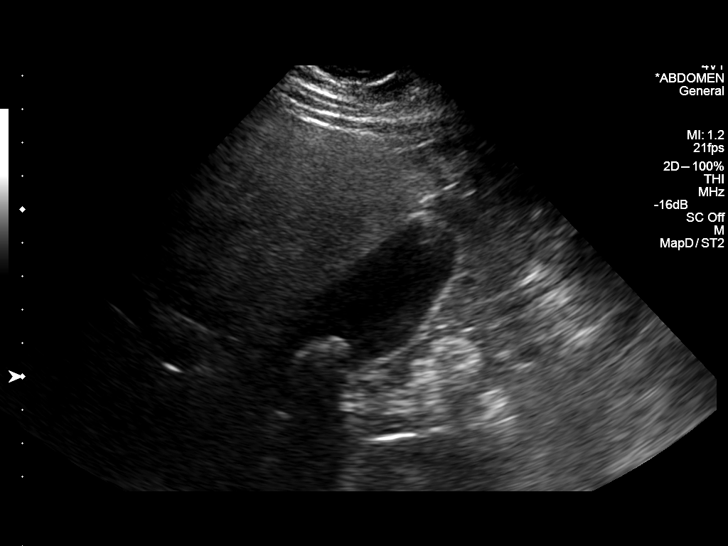
[im 29/35]
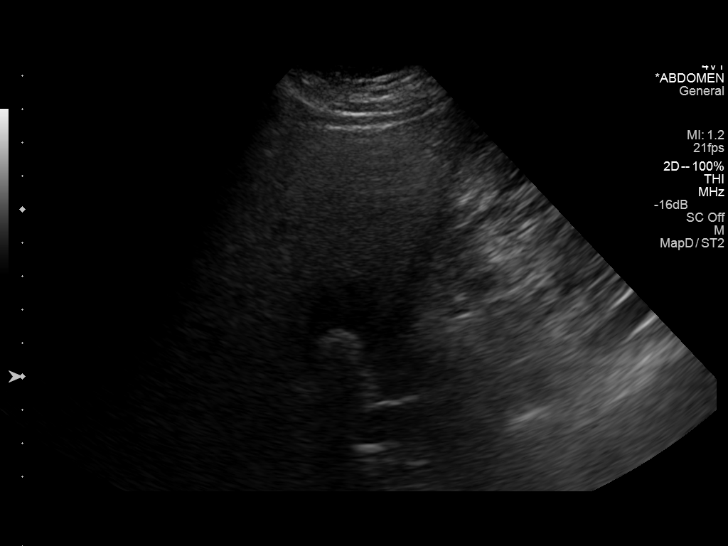
[im 32/35]
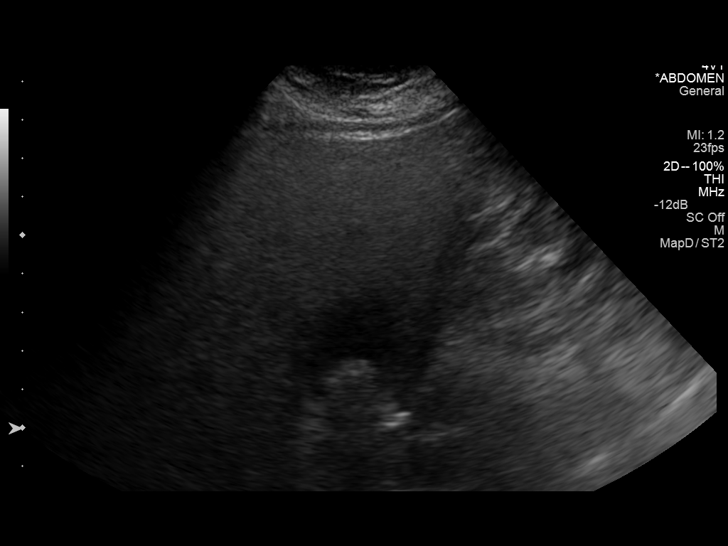
[im 35/35]
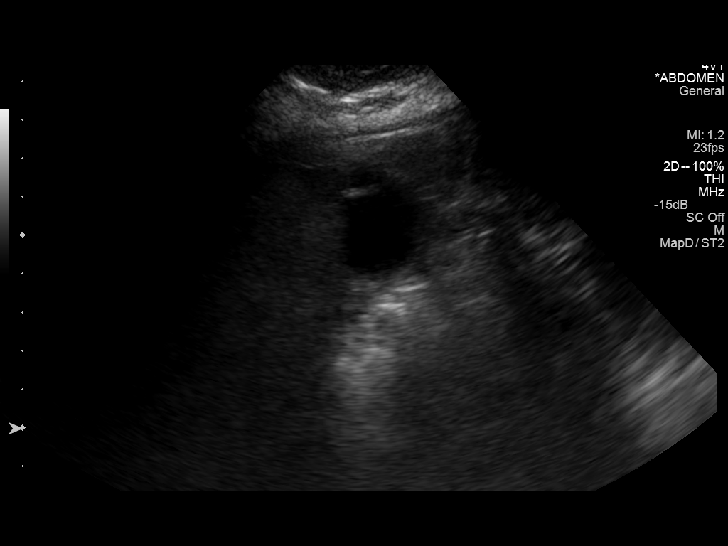

[14 of 25 positions shown; findings below may reference images not displayed]

FINDINGS: Gallbladder:

A 2 cm gallstone is noted within the gallbladder. No wall thickening
or pericholecystic fluid is noted. A negative sonographic Murphy
sign is noted.

Common bile duct:

Diameter: 3.2 mm.

Liver:

Diffusely increased in echogenicity consistent with fatty
infiltration.
IMPRESSION: Fatty liver.

Cholelithiasis without complicating factors.
